# Patient Record
Sex: Female | Born: 1955 | Race: Black or African American | Hispanic: No | Marital: Single | Smoking: Never smoker
Health system: Southern US, Community
[De-identification: ages and names within clinical notes are randomized; demographics above are authoritative.]

## PROBLEM LIST (undated history)

## (undated) DIAGNOSIS — Z923 Personal history of irradiation: Secondary | ICD-10-CM

## (undated) DIAGNOSIS — Z9221 Personal history of antineoplastic chemotherapy: Secondary | ICD-10-CM

## (undated) DIAGNOSIS — I10 Essential (primary) hypertension: Secondary | ICD-10-CM

## (undated) DIAGNOSIS — C801 Malignant (primary) neoplasm, unspecified: Secondary | ICD-10-CM

## (undated) HISTORY — PX: BREAST SURGERY: SHX581

## (undated) HISTORY — PX: FRACTURE SURGERY: SHX138

---

## 1991-02-08 HISTORY — PX: MASTECTOMY: SHX3

## 1995-02-08 DIAGNOSIS — C50919 Malignant neoplasm of unspecified site of unspecified female breast: Secondary | ICD-10-CM

## 1995-02-08 HISTORY — DX: Malignant neoplasm of unspecified site of unspecified female breast: C50.919

## 2005-10-22 ENCOUNTER — Emergency Department: Payer: Self-pay | Admitting: Emergency Medicine

## 2008-12-03 ENCOUNTER — Ambulatory Visit: Payer: Self-pay | Admitting: Internal Medicine

## 2010-06-08 DIAGNOSIS — H35419 Lattice degeneration of retina, unspecified eye: Secondary | ICD-10-CM | POA: Insufficient documentation

## 2010-06-08 DIAGNOSIS — H5200 Hypermetropia, unspecified eye: Secondary | ICD-10-CM | POA: Insufficient documentation

## 2010-06-08 DIAGNOSIS — H04209 Unspecified epiphora, unspecified lacrimal gland: Secondary | ICD-10-CM | POA: Insufficient documentation

## 2017-10-30 ENCOUNTER — Other Ambulatory Visit: Payer: Self-pay

## 2017-10-30 ENCOUNTER — Encounter: Payer: Self-pay | Admitting: Emergency Medicine

## 2017-10-30 ENCOUNTER — Ambulatory Visit
Admission: EM | Admit: 2017-10-30 | Discharge: 2017-10-30 | Disposition: A | Discharge status: Home / Self Care | Payer: BC Managed Care – PPO | Attending: Internal Medicine | Admitting: Internal Medicine

## 2017-10-30 DIAGNOSIS — R05 Cough: Secondary | ICD-10-CM | POA: Diagnosis not present

## 2017-10-30 DIAGNOSIS — J069 Acute upper respiratory infection, unspecified: Secondary | ICD-10-CM | POA: Diagnosis not present

## 2017-10-30 HISTORY — DX: Essential (primary) hypertension: I10

## 2017-10-30 HISTORY — DX: Malignant (primary) neoplasm, unspecified: C80.1

## 2017-10-30 MED ORDER — HYDROCOD POLST-CPM POLST ER 10-8 MG/5ML PO SUER: 5.0000 mL | Freq: Two times a day (BID) | ORAL | 0 refills | Status: DC

## 2017-10-30 MED ORDER — BENZONATATE 200 MG PO CAPS: ORAL_CAPSULE | ORAL | 0 refills | Status: DC

## 2017-10-30 MED ORDER — FLUTICASONE PROPIONATE 50 MCG/ACT NA SUSP: 2.0000 | Freq: Every day | NASAL | 0 refills | Status: DC

## 2017-10-30 NOTE — ED Provider Notes (Signed)
[facility]    CSN: [id number] Arrival date & time: 10/30/17  1454     History   Chief Complaint Chief Complaint  Patient presents with  . URI    HPI Dorothy Mann is a 62 y.o. female.   HPI  62 year old female presents with cough that is nonproductive postnasal drip and sinus congestion.  Started about a week ago.  She said no fever or chills.  She never smoked.  Tried over-the-counter medications which have not been beneficial.  Vital signs today are afebrile.  Pulse rate of 84 blood pressure 122/88 respirations 16 and O2 sats on room air is 100%.       Past Medical History:  Diagnosis Date  . Cancer [facility])    breast cancer  . Hypertension     There are no active problems to display for this patient.   Past Surgical History:  Procedure Laterality Date  . BREAST SURGERY Left   . FRACTURE SURGERY Right     OB History   None      Home Medications    Prior to Admission medications   Medication Sig Start Date End Date Taking? Authorizing Provider  alendronate (FOSAMAX) 70 MG tablet Take by mouth. 04/29/16  Yes [provider]  Cholecalciferol (VITAMIN D-1000 MAX ST) 1000 units tablet Take by mouth.   Yes [provider]  ferrous sulfate 325 (65 FE) MG tablet Take by mouth.   Yes [provider]  folic acid (FOLVITE) 409 MCG tablet Take by mouth.   Yes [provider]  losartan (COZAAR) 100 MG tablet TAKE 1 TABLET(100 MG) BY MOUTH DAILY 04/26/17  Yes [provider]  magnesium oxide (MAG-OX) 400 MG tablet Take by mouth. 09/29/16  Yes [provider]  metoprolol succinate (TOPROL-XL) 100 MG 24 hr tablet TAKE 2 TABLETS BY MOUTH DAILY 03/07/17  Yes [provider]  vitamin B-12 (CYANOCOBALAMIN) 100 MCG tablet Take by mouth.   Yes [provider]  benzonatate (TESSALON) 200 MG capsule Take one cap TID PRN cough 10/30/17   Lorin Picket, PA-C  chlorpheniramine-HYDROcodone [facility] ER) 10-8 MG/5ML SUER Take 5 mLs by mouth 2 (two) times daily. 10/30/17   Lorin Picket, PA-C  fluticasone (FLONASE) 50 MCG/ACT nasal spray Place 2 sprays into both nostrils daily. 10/30/17   Lorin Picket, PA-C    Family History Family History  Problem Relation Age of Onset  . Healthy Mother     Social History Social History   Tobacco Use  . Smoking status: Never Smoker  . Smokeless tobacco: Never Used  Substance Use Topics  . Alcohol use: Never    Frequency: Never  . Drug use: Never     Allergies   Sulfa antibiotics   Review of Systems Review of Systems  Constitutional: Positive for activity change. Negative for appetite change, chills, fatigue and fever.  HENT: Positive for congestion and postnasal drip.   Respiratory: Positive for cough. Negative for shortness of breath, wheezing and stridor.   All other systems reviewed and are negative.    Physical Exam Triage Vital Signs ED Triage Vitals  Enc Vitals Group     BP 10/30/17 1531 122/88     Pulse Rate 10/30/17 1531 84     Resp 10/30/17 1531 16     Temp 10/30/17 1531 98.7 F (37.1 C)     Temp Source 10/30/17 1531 Oral     SpO2 10/30/17 1531 100 %  Weight 10/30/17 1529 110 lb (49.9 kg)     Height 10/30/17 1529 5\' 3"  (1.6 m)     Head Circumference --      Peak Flow --      Pain Score 10/30/17 1529 4     Pain Loc --      Pain Edu? --      Excl. in [place]? --    No data found.  Updated Vital Signs BP 122/88 (BP Location: Left Arm)   Pulse 84   Temp 98.7 F (37.1 C) (Oral)   Resp 16   Ht 5\' 3"  (1.6 m)   Wt 110 lb (49.9 kg)   SpO2 100%   BMI 19.49 kg/m    Visual Acuity Right Eye Distance:   Left Eye Distance:   Bilateral Distance:    Right Eye Near:   Left Eye Near:    Bilateral Near:     Physical Exam  Constitutional: She is oriented to person, place, and time. She appears well-developed and well-nourished. No distress.  HENT:  Head: Normocephalic.  Right Ear: External  ear normal.  Left Ear: External ear normal.  Nose: Nose normal.  Mouth/Throat: Oropharynx is clear and moist. No oropharyngeal exudate.  Eyes: Pupils are equal, round, and reactive to light. Right eye exhibits no discharge. Left eye exhibits no discharge.  Neck: Normal range of motion.  Pulmonary/Chest: Effort normal and breath sounds normal.  Musculoskeletal: Normal range of motion.  Lymphadenopathy:    She has no cervical adenopathy.  Neurological: She is alert and oriented to person, place, and time.  Skin: Skin is warm and dry. She is not diaphoretic.  Psychiatric: She has a normal mood and affect. Her behavior is normal. Judgment and thought content normal.  Nursing note and vitals reviewed.    UC Treatments / Results  Labs (all labs ordered are listed, but only abnormal results are displayed) Labs Reviewed - No data to display  EKG None  Radiology No results found.  Procedures Procedures (including critical care time)  Medications Ordered in UC Medications - No data to display  Initial Impression / Assessment and Plan / UC Course  I have reviewed the triage vital signs and the nursing notes.  Pertinent labs & imaging results that were available during my care of the patient were reviewed by me and considered in my medical decision making (see chart for details).     Advised the patient that this is likely a viral illness does not require antibiotics at this time.  Treat her symptomatically.  Prescribed for her cough suppressants.  Urged to increase fluid intake.  Also use of Flonase nasal spray for the next 2 to 3 weeks to help promote drainage.  She has high fevers or the cough is productive should return to our clinic or follow-up with primary care physician Final Clinical Impressions(s) / UC Diagnoses   Final diagnoses:  Upper respiratory tract infection, unspecified type   Discharge Instructions   None    ED Prescriptions    Medication Sig Dispense Auth.  Provider   benzonatate (TESSALON) 200 MG capsule Take one cap TID PRN cough 30 capsule Lorin Picket, PA-C   chlorpheniramine-HYDROcodone (TUSSIONEX PENNKINETIC ER) 10-8 MG/5ML SUER Take 5 mLs by mouth 2 (two) times daily. 60 mL Crecencio Mc P, PA-C   fluticasone (FLONASE) 50 MCG/ACT nasal spray Place 2 sprays into both nostrils daily. 16 g Lorin Picket, PA-C     Controlled Substance Prescriptions [place] Controlled Substance  Registry consulted? Not Applicable  Lorin Picket, PA-C 10/30/17 2105

## 2017-10-30 NOTE — ED Triage Notes (Signed)
Pt c/o cough, post nasal drip, sinus congestion. Started about a week ago. Denies fever.

## 2017-12-07 ENCOUNTER — Other Ambulatory Visit: Payer: Self-pay

## 2017-12-07 ENCOUNTER — Encounter: Payer: Self-pay | Admitting: Family Medicine

## 2017-12-07 ENCOUNTER — Telehealth: Payer: Self-pay | Admitting: Family Medicine

## 2017-12-07 ENCOUNTER — Ambulatory Visit: Payer: BC Managed Care – PPO | Admitting: Family Medicine

## 2017-12-07 VITALS — BP 94/65 | HR 67 | Temp 98.3°F | Ht 63.5 in | Wt 121.0 lb

## 2017-12-07 DIAGNOSIS — Z853 Personal history of malignant neoplasm of breast: Secondary | ICD-10-CM | POA: Insufficient documentation

## 2017-12-07 DIAGNOSIS — I10 Essential (primary) hypertension: Secondary | ICD-10-CM | POA: Insufficient documentation

## 2017-12-07 DIAGNOSIS — I5022 Chronic systolic (congestive) heart failure: Secondary | ICD-10-CM | POA: Diagnosis not present

## 2017-12-07 DIAGNOSIS — Z7689 Persons encountering health services in other specified circumstances: Secondary | ICD-10-CM

## 2017-12-07 DIAGNOSIS — I428 Other cardiomyopathies: Secondary | ICD-10-CM

## 2017-12-07 NOTE — Assessment & Plan Note (Signed)
Followed by Cardiology. Stable, continue current regimen 

## 2017-12-07 NOTE — Progress Notes (Signed)
   BP 94/65   Pulse 67   Temp 98.3 F (36.8 C) (Oral)   Ht 5' 3.5" (1.613 m)   Wt 121 lb (54.9 kg)   SpO2 97%   BMI 21.10 kg/m    Subjective:    Patient ID: Dorothy Mann, female    DOB: 01-13-1956, 62 y.o.   MRN: [id number]  HPI: Dorothy Mann is a 63 y.o. female  Chief Complaint  Patient presents with  . New Patient (Initial Visit)    pt would like to discuss about her feet pain and swelling x 2 weeks off and on   Here today for new patient visit.   Followed by Cardiology for NICM, systolic HF, HTN. Last visit yesterday where lasix was increased to 40 mg for the next week given increased LE edema. Has f/u in 1 week for that.   Only other medical issue known is hx of left breast cancer 27 years ago. Followed by Oncology annually. In remission.   Last CPE was 06/2016.   Relevant past medical, surgical, family and social history reviewed and updated as indicated. Interim medical history since our last visit reviewed. Allergies and medications reviewed and updated.  Review of Systems  Per HPI unless specifically indicated above     Objective:    BP 94/65   Pulse 67   Temp 98.3 F (36.8 C) (Oral)   Ht 5' 3.5" (1.613 m)   Wt 121 lb (54.9 kg)   SpO2 97%   BMI 21.10 kg/m   Wt Readings from Last 3 Encounters:  12/07/17 121 lb (54.9 kg)  10/30/17 110 lb (49.9 kg)    Physical Exam  Constitutional: She is oriented to person, place, and time. She appears well-developed and well-nourished.  HENT:  Head: Atraumatic.  Eyes: Conjunctivae and EOM are normal.  Neck: Normal range of motion. Neck supple.  Cardiovascular: Normal rate and regular rhythm.  Pulmonary/Chest: Effort normal and breath sounds normal.  Musculoskeletal: Normal range of motion. She exhibits edema (2+ pitting edema b/l LEs).  Neurological: She is alert and oriented to person, place, and time.  Skin: Skin is warm and dry.  Psychiatric: She has a normal mood and affect. Her behavior is normal.  Nursing  note and vitals reviewed.   No results found for this or any previous visit.    Assessment & Plan:   Problem List Items Addressed This Visit      Cardiovascular and Mediastinum   Hypertension    Followed by Cardiology. Stable, continue current regimen      Relevant Medications   furosemide (LASIX) 20 MG tablet   spironolactone (ALDACTONE) 25 MG tablet   Chronic systolic HF (heart failure) ([place])    Followed by Cardiology. Stable, continue current regimen      Relevant Medications   furosemide (LASIX) 20 MG tablet   spironolactone (ALDACTONE) 25 MG tablet   NICM (nonischemic cardiomyopathy) ([place])    Followed by Cardiology. Stable, continue current regimen      Relevant Medications   furosemide (LASIX) 20 MG tablet   spironolactone (ALDACTONE) 25 MG tablet     Other   History of left breast cancer    In remission, followed annually by Oncology       Other Visit Diagnoses    Encounter to establish care    -  Primary       Follow up plan: Return for CPE.

## 2017-12-07 NOTE — Assessment & Plan Note (Signed)
In remission, followed annually by Oncology

## 2017-12-07 NOTE — Telephone Encounter (Signed)
Noted. Will follow up on this at upcoming appt

## 2017-12-07 NOTE — Patient Instructions (Signed)
Follow up for CPE 

## 2017-12-07 NOTE — Telephone Encounter (Signed)
Patient wanted to let you know that the center part of her back hurts after driving to and from work. She forgot to mention this to you during the visit.  She said it aches like a toothache. If you need to reach her regarding this issue before her next visit she said to please call.  Thank you  [phone]

## 2017-12-22 ENCOUNTER — Encounter: Payer: BC Managed Care – PPO | Admitting: Family Medicine

## 2018-01-10 ENCOUNTER — Encounter: Payer: BC Managed Care – PPO | Admitting: Family Medicine

## 2018-03-22 ENCOUNTER — Ambulatory Visit (INDEPENDENT_AMBULATORY_CARE_PROVIDER_SITE_OTHER): Payer: BC Managed Care – PPO

## 2018-03-22 ENCOUNTER — Encounter: Payer: Self-pay | Admitting: Podiatry

## 2018-03-22 ENCOUNTER — Other Ambulatory Visit: Payer: Self-pay | Admitting: Podiatry

## 2018-03-22 ENCOUNTER — Ambulatory Visit: Payer: BC Managed Care – PPO | Admitting: Podiatry

## 2018-03-22 VITALS — BP 114/76 | HR 83

## 2018-03-22 DIAGNOSIS — M79673 Pain in unspecified foot: Secondary | ICD-10-CM

## 2018-03-22 DIAGNOSIS — M722 Plantar fascial fibromatosis: Secondary | ICD-10-CM

## 2018-03-22 NOTE — Progress Notes (Signed)
This patient presents the office with chief complaint of burning and sharp pain noted through the bottom of her feet.  She states that this problem occurs walking and wearing her shoes.  She also has a history of excessive swelling noted in both her leg and feet due to heart pathology.  She states that this pain has been present for approximately 4 weeks and not improving.  She says she was seen by her cardiologist who prescribed gabapentin for the burning.  She denies any history of trauma or injury to the foot.  She presents the office today for an evaluation and treatment of her painful feet.  Vascular  Dorsalis pedis and posterior tibial pulses are palpable  B/L.  Capillary return  WNL.  Temperature gradient is  WNL.  Skin turgor  WNL  Sensorium  Senn Weinstein monofilament wire  WNL. Normal tactile sensation.  Nail Exam  Patient has normal nails with no evidence of bacterial or fungal infection.  Orthopedic  Exam  Muscle tone and muscle strength  WNL.  No limitations of motion feet  B/L.  No crepitus or joint effusion noted.  Foot type is unremarkable and digits show no abnormalities.  Bony prominences are unremarkable. Palpable pain in the center of her arch  B/L.  Swelling in lower leg and feet.   Skin  No open lesions.  Normal skin texture and turgor.  Plantar fasciitis  B/L  IE.  Discussed this condition with this patient.  Explained that she has plantar fasciitis and prescribed power step insoles to be worn in her shoes.  These insoles were placed in her shoes and she felt immediate relief.  Patient is to return to the office in 3 weeks for continued evaluation and treatment  Gardiner Barefoot DPM

## 2018-04-12 ENCOUNTER — Ambulatory Visit: Payer: BC Managed Care – PPO | Admitting: Podiatry

## 2018-09-11 ENCOUNTER — Ambulatory Visit
Admission: EM | Admit: 2018-09-11 | Discharge: 2018-09-11 | Disposition: A | Discharge status: Home / Self Care | Payer: BC Managed Care – PPO | Attending: Family Medicine | Admitting: Family Medicine

## 2018-09-11 ENCOUNTER — Encounter: Payer: Self-pay | Admitting: Emergency Medicine

## 2018-09-11 ENCOUNTER — Ambulatory Visit (INDEPENDENT_AMBULATORY_CARE_PROVIDER_SITE_OTHER): Payer: BC Managed Care – PPO

## 2018-09-11 ENCOUNTER — Other Ambulatory Visit: Payer: Self-pay

## 2018-09-11 DIAGNOSIS — M79645 Pain in left finger(s): Secondary | ICD-10-CM

## 2018-09-11 DIAGNOSIS — M7989 Other specified soft tissue disorders: Secondary | ICD-10-CM | POA: Diagnosis not present

## 2018-09-11 MED ORDER — DOXYCYCLINE HYCLATE 100 MG PO CAPS: 100.0000 mg | ORAL_CAPSULE | Freq: Two times a day (BID) | ORAL | 0 refills | Status: DC

## 2018-09-11 MED ORDER — PREDNISONE 10 MG (21) PO TBPK: ORAL_TABLET | ORAL | 0 refills | Status: DC

## 2018-09-11 NOTE — ED Triage Notes (Signed)
Pt c/o left hand pain and swelling. swelling started in her left ring finger and goes up into her hand. Started about a week ago. No known injury.

## 2018-09-11 NOTE — ED Provider Notes (Signed)
[facility]    CSN: [id number] Arrival date & time: 09/11/18  1439  History   Chief Complaint Chief Complaint  Patient presents with   Hand Pain    left   HPI  63 year old female presents with the above complaint.  Patient reports that approximately 3 weeks ago she developed pain of her left ring finger.  Over the past week and over the weekend she developed worsening pain, swelling, and nail erythema (PIP joint).  Erythema is extending proximally on the dorsum of her hand.  Denies injury.  No fever.  Area is warm to the touch.  Pain is 8/10 in severity.  Worse with palpation and range of motion.  No other joints affected.  No other complaints or concerns at this time.  PMH, Surgical Hx, Family Hx, Social History reviewed and updated as below.  Past Medical History:  Diagnosis Date   Cancer [facility])    breast cancer   Hypertension     Patient Active Problem List   Diagnosis Date Noted   Hypertension 96/22/2979   Chronic systolic HF (heart failure) ([place]) 12/07/2017   NICM (nonischemic cardiomyopathy) ([place]) 12/07/2017   History of left breast cancer 12/07/2017    Past Surgical History:  Procedure Laterality Date   BREAST SURGERY Left    FRACTURE SURGERY Right     OB History   No obstetric history on file.      Home Medications    Prior to Admission medications   Medication Sig Start Date End Date Taking? Authorizing Provider  alendronate (FOSAMAX) 70 MG tablet Take by mouth. 04/29/16  Yes [provider]  Cholecalciferol (VITAMIN D-1000 MAX ST) 1000 units tablet Take by mouth.   Yes [provider]  ferrous sulfate 325 (65 FE) MG tablet Take by mouth.   Yes [provider]  folic acid (FOLVITE) 892 MCG tablet Take by mouth.   Yes [provider]  furosemide (LASIX) 20 MG tablet  03/20/18  Yes [provider]  gabapentin (NEURONTIN) 100 MG capsule Take by mouth. 03/14/18 03/14/19 Yes [provider]   magnesium oxide (MAG-OX) 400 MG tablet Take by mouth. 09/29/16  Yes [provider]  MAGNESIUM-OXIDE 400 (241.3 Mg) MG tablet TK 1 T PO  QD 11/23/17  Yes [provider]  metoprolol succinate (TOPROL-XL) 100 MG 24 hr tablet Take by mouth. 12/19/17  Yes [provider]  vitamin B-12 (CYANOCOBALAMIN) 100 MCG tablet Take by mouth.   Yes [provider]  doxycycline (VIBRAMYCIN) 100 MG capsule Take 1 capsule (100 mg total) by mouth 2 (two) times daily. 09/11/18   Coral Spikes, DO  predniSONE (STERAPRED UNI-PAK 21 TAB) 10 MG (21) TBPK tablet 6 tablets on day 1; decrease by 1 tablet daily until gone. 09/11/18   Coral Spikes, DO  spironolactone (ALDACTONE) 25 MG tablet Take by mouth. 01/17/17 01/17/18  [provider]  UNABLE TO FIND Take by mouth.    [provider]  UNABLE TO FIND TK 2 TS PO D 01/19/18   [provider]  losartan (COZAAR) 100 MG tablet TAKE 1 TABLET(100 MG) BY MOUTH DAILY 04/26/17 09/11/18  [provider]    Family History Family History  Problem Relation Age of Onset   Healthy Mother     Social History Social History   Tobacco Use   Smoking status: Never Smoker   Smokeless tobacco: Never Used  Substance Use Topics   Alcohol use: Not Currently  Frequency: Never   Drug use: Never     Allergies   Sulfa antibiotics, Aspirin, and Ramipril   Review of Systems Review of Systems  Constitutional: Negative.   Musculoskeletal:       Pain, swelling, redness - Left ring finger.   Physical Exam Triage Vital Signs ED Triage Vitals  Enc Vitals Group     BP 09/11/18 1451 98/83     Pulse Rate 09/11/18 1451 88     Resp 09/11/18 1451 18     Temp 09/11/18 1451 98.6 F (37 C)     Temp Source 09/11/18 1451 Oral     SpO2 09/11/18 1451 100 %     Weight 09/11/18 1448 114 lb (51.7 kg)     Height 09/11/18 1448 5\' 4"  (1.626 m)     Head Circumference --      Peak Flow --      Pain Score 09/11/18 1448 8       Pain Loc --      Pain Edu? --      Excl. in [place]? --    Updated Vital Signs BP 98/83 (BP Location: Left Arm)    Pulse 88    Temp 98.6 F (37 C) (Oral)    Resp 18    Ht 5\' 4"  (1.626 m)    Wt 51.7 kg    SpO2 100%    BMI 19.57 kg/m   Visual Acuity Right Eye Distance:   Left Eye Distance:   Bilateral Distance:    Right Eye Near:   Left Eye Near:    Bilateral Near:     Physical Exam Vitals signs and nursing note reviewed.  Constitutional:      General: She is not in acute distress.    Appearance: Normal appearance.  HENT:     Head: Normocephalic and atraumatic.  Eyes:     General:        Right eye: No discharge.        Left eye: No discharge.     Conjunctiva/sclera: Conjunctivae normal.  Pulmonary:     Effort: Pulmonary effort is normal. No respiratory distress.  Musculoskeletal:     Comments: Left 4th digit -severe swelling of the PIP joint.  Extends proximally.  Area is warm to touch and is erythematous as well.  Skin:    Comments: Erythema noted to the left ring finger and extends proximally over the dorsum of the hand to the wrist.  Neurological:     Mental Status: She is alert.  Psychiatric:        Mood and Affect: Mood normal.        Behavior: Behavior normal.    UC Treatments / Results  Labs (all labs ordered are listed, but only abnormal results are displayed) Labs Reviewed - No data to display  EKG   Radiology Dg Finger Ring Left  Result Date: 09/11/2018 CLINICAL DATA:  Pain, redness, and swelling of LEFT ring finger for about a week, goes up into hand, no known injury EXAM: LEFT RING FINGER 2+V COMPARISON:  None FINDINGS: Osseous demineralization. Soft tissue swelling, centered at PIP joint. No definite acute fracture, dislocation, or bone destruction. PIP joint is obscured on the lateral view. IMPRESSION: Soft tissue swelling PIP joint LEFT ring finger. No definite acute bony findings though the PIP joint is obscured on the lateral view. Electronically  Signed   By: Lavonia Dana M.D.   On: 09/11/2018 15:43    Procedures Procedures (including critical care  time)  Medications Ordered in UC Medications - No data to display  Initial Impression / Assessment and Plan / UC Course  I have reviewed the triage vital signs and the nursing notes.  Pertinent labs & imaging results that were available during my care of the patient were reviewed by me and considered in my medical decision making (see chart for details).    63 year old female presents with pain, swelling, and erythema which starts at the left fourth PIP joint and extends proximally.  Concern for cellulitis.  Placing on doxycycline.  Also placing on prednisone given likely synovitis as well.  Final Clinical Impressions(s) / UC Diagnoses   Final diagnoses:  Finger swelling     Discharge Instructions     Medication as prescribed.  Please see [place] or your primary for follow up.  If you worsen, go to the hospital.  Take care  Dr. Lacinda Axon    ED Prescriptions    Medication Sig [place]. Provider   predniSONE (STERAPRED UNI-PAK 21 TAB) 10 MG (21) TBPK tablet 6 tablets on day 1; decrease by 1 tablet daily until gone. 21 tablet Shamikia Linskey G, DO   doxycycline (VIBRAMYCIN) 100 MG capsule Take 1 capsule (100 mg total) by mouth 2 (two) times daily. 14 capsule Coral Spikes, DO     Controlled Substance Prescriptions [place] Controlled Substance Registry consulted? Not Applicable   Coral Spikes, DO 09/11/18 1649

## 2018-09-11 NOTE — Discharge Instructions (Addendum)
Medication as prescribed.  Please see [place] or your primary for follow up.  If you worsen, go to the hospital.  Take care  Dr. Lacinda Axon

## 2019-01-15 ENCOUNTER — Other Ambulatory Visit: Payer: Self-pay | Admitting: Hematology and Oncology

## 2019-01-15 DIAGNOSIS — Z1231 Encounter for screening mammogram for malignant neoplasm of breast: Secondary | ICD-10-CM

## 2019-01-16 ENCOUNTER — Other Ambulatory Visit: Payer: Self-pay | Admitting: *Deleted

## 2019-01-16 ENCOUNTER — Inpatient Hospital Stay
Admission: RE | Admit: 2019-01-16 | Discharge: 2019-01-16 | Disposition: A | Discharge status: Home / Self Care | Payer: Self-pay | Source: Ambulatory Visit | Attending: *Deleted | Admitting: *Deleted

## 2019-01-16 DIAGNOSIS — Z1231 Encounter for screening mammogram for malignant neoplasm of breast: Secondary | ICD-10-CM

## 2019-01-24 ENCOUNTER — Other Ambulatory Visit: Payer: Self-pay

## 2019-01-24 ENCOUNTER — Ambulatory Visit
Admission: RE | Admit: 2019-01-24 | Discharge: 2019-01-24 | Disposition: A | Discharge status: Home / Self Care | Payer: BC Managed Care – PPO | Source: Ambulatory Visit | Attending: Hematology and Oncology | Admitting: Hematology and Oncology

## 2019-01-24 DIAGNOSIS — Z1231 Encounter for screening mammogram for malignant neoplasm of breast: Secondary | ICD-10-CM | POA: Diagnosis not present

## 2019-01-24 HISTORY — DX: Personal history of antineoplastic chemotherapy: Z92.21

## 2019-01-24 HISTORY — DX: Personal history of irradiation: Z92.3

## 2019-03-05 ENCOUNTER — Ambulatory Visit (INDEPENDENT_AMBULATORY_CARE_PROVIDER_SITE_OTHER): Payer: BC Managed Care – PPO

## 2019-03-05 ENCOUNTER — Ambulatory Visit
Admission: EM | Admit: 2019-03-05 | Discharge: 2019-03-05 | Disposition: A | Discharge status: Home / Self Care | Payer: BC Managed Care – PPO | Attending: Emergency Medicine | Admitting: Emergency Medicine

## 2019-03-05 ENCOUNTER — Other Ambulatory Visit: Payer: Self-pay

## 2019-03-05 DIAGNOSIS — M25561 Pain in right knee: Secondary | ICD-10-CM

## 2019-03-05 DIAGNOSIS — M25461 Effusion, right knee: Secondary | ICD-10-CM

## 2019-03-05 DIAGNOSIS — M25562 Pain in left knee: Secondary | ICD-10-CM | POA: Diagnosis not present

## 2019-03-05 MED ORDER — IBUPROFEN 400 MG PO TABS: 400.0000 mg | ORAL_TABLET | Freq: Four times a day (QID) | ORAL | 0 refills | Status: DC | PRN

## 2019-03-05 NOTE — ED Provider Notes (Signed)
HPI  SUBJECTIVE:  Dorothy Mann is a 64 y.o. female who presents with bilateral knee pain for a week.  She states it has been alternating between the left and right knees, but today both of them hurt.  She states that she could not walk this morning secondary to the pain.  Was able to walk yesterday.  She describes the pain as sore, constant, located anteriorly.  No trauma, change in physical activity, body aches, fevers, erythema, swelling.  No calf or leg swelling.  She denies CHF symptoms.  She denies leg weakness, new or different distal numbness or tingling.  No sensation of instability.  Her knees have not given way.  She tried an Ace wrap with significant improvement in her symptoms yesterday and 400 mg of ibuprofen with improvement in her symptoms today.  Symptoms are worse with weightbearing.  States that her hip and ankle are without pain or injury.  She also reports bilateral feet swelling after sitting for prolonged period of time which is worse in the evening, and has completely resolved by the morning.  Again denies calf or leg swelling.  She has a past medical history of CHF with an EF of 40 to 45%, tibial fracture right knee that is post reconstruction, breast cancer 27 years ago status post chemo with resulting osteoporosis.  No history of diabetes, hypertension, GI bleed, peptic ulcer disease, chronic kidney disease, rheumatoid arthritis, osteoarthritis.  PMD: Dr. Lavera Guise.    Past Medical History:  Diagnosis Date  . Breast cancer ([place]) 1997   left breast  . Cancer ([place])    breast cancer  . Hypertension   . Personal history of chemotherapy   . Personal history of radiation therapy     Past Surgical History:  Procedure Laterality Date  . BREAST SURGERY Left   . FRACTURE SURGERY Right   . MASTECTOMY Left 1993    Family History  Problem Relation Age of Onset  . Healthy Mother   . Breast cancer Neg Hx     Social History   Tobacco Use  . Smoking status: Never Smoker   . Smokeless tobacco: Never Used  Substance Use Topics  . Alcohol use: Not Currently  . Drug use: Never    No current facility-administered medications for this encounter.  Current Outpatient Medications:  .  alendronate (FOSAMAX) 70 MG tablet, Take by mouth., Disp: , Rfl:  .  Cholecalciferol (VITAMIN D-1000 MAX ST) 1000 units tablet, Take by mouth., Disp: , Rfl:  .  ferrous sulfate 325 (65 FE) MG tablet, Take by mouth., Disp: , Rfl:  .  folic acid (FOLVITE) A999333 MCG tablet, Take by mouth., Disp: , Rfl:  .  furosemide (LASIX) 20 MG tablet, , Disp: , Rfl:  .  gabapentin (NEURONTIN) 100 MG capsule, Take by mouth., Disp: , Rfl:  .  magnesium oxide (MAG-OX) 400 MG tablet, Take by mouth., Disp: , Rfl:  .  MAGNESIUM-OXIDE 400 (241.3 Mg) MG tablet, TK 1 T PO  QD, Disp: , Rfl: 2 .  metoprolol succinate (TOPROL-XL) 100 MG 24 hr tablet, Take by mouth., Disp: , Rfl:  .  spironolactone (ALDACTONE) 25 MG tablet, Take by mouth., Disp: , Rfl:  .  vitamin B-12 (CYANOCOBALAMIN) 100 MCG tablet, Take by mouth., Disp: , Rfl:  .  ibuprofen (ADVIL) 400 MG tablet, Take 1 tablet (400 mg total) by mouth every 6 (six) hours as needed., Disp: 30 tablet, Rfl: 0  Allergies  Allergen Reactions  . Sulfa Antibiotics   .  Aspirin Anxiety  . Ramipril     Chronic dry cough     ROS  As noted in HPI.   Physical Exam  BP 112/79 (BP Location: Right Arm)   Pulse 92   Temp 98.5 F (36.9 C) (Oral)   Resp 16   Ht 5\' 4"  (1.626 m)   Wt 49.9 kg   SpO2 99%   BMI 18.88 kg/m   Constitutional: Well developed, well nourished, no acute distress Eyes:  EOMI, conjunctiva normal bilaterally HENT: Normocephalic, atraumatic,mucus membranes moist Respiratory: Normal inspiratory effort Cardiovascular: Normal rate GI: nondistended skin: No rash, skin intact Musculoskeletal:  L Knee ROM decreased due to pain, Flexion  intact,  Patella tender, Patellar tendon tender, Medial joint NT, Lateral joint NT, Popliteal region NT,  Varus MCL stress testing stable, Valgus LCL stress testing stable, McMurray's testing  abnormal, Lachman's negative. Distal NVI with intact baseline sensation / motor / pulse distal to knee.  No effusion. No erythema. No increased temperature. No crepitus.  No calf swelling, edema.  R Knee large scar over the tibia.  ROM decreased due to pain, Flexion  Intact, tenderness lateral to the patella . patella NT, Patellar tendon NT, Medial joint NT, Lateral joint NT, Popliteal region NT, Varus MCL stress testing stable, Valgus LCL stress testing stable but painful, McMurray's testing normal, Lachman's negative. Distal NVI with intact baseline sensation / motor / pulse distal to knee.  + effusion. No erythema. No increased temperature. No crepitus.  No calf swelling, edema  Neurologic: Alert & oriented x 3, no focal neuro deficits Psychiatric: Speech and behavior appropriate   ED Course   Medications - No data to display  Orders Placed This Encounter  Procedures  . DG Knee Complete 4 Views Left    Standing Status:   Standing    Number of Occurrences:   1    Order Specific Question:   Reason for Exam (SYMPTOM  OR DIAGNOSIS REQUIRED)    Answer:   Atraumatic bilateral knee pain rule out arthritis effusion facture  . DG Knee Complete 4 Views Right    Standing Status:   Standing    Number of Occurrences:   1    Order Specific Question:   Reason for Exam (SYMPTOM  OR DIAGNOSIS REQUIRED)    Answer:   Atraumatic bilateral knee pain rule out arthritis effusion facture    No results found for this or any previous visit (from the past 24 hour(s)). DG Knee Complete 4 Views Left  Result Date: 03/05/2019 CLINICAL DATA:  Bilateral knee pain EXAM: LEFT KNEE - COMPLETE 4+ VIEW COMPARISON:  None. FINDINGS: No visible joint effusion. Normal joint space height. No osteophytes. No cysts or other focal lesions. IMPRESSION: Normal Electronically Signed   By: Nelson Chimes M.D.   On: 03/05/2019 12:39   DG Knee  Complete 4 Views Right  Result Date: 03/05/2019 CLINICAL DATA:  Bilateral knee pain EXAM: RIGHT KNEE - COMPLETE 4+ VIEW COMPARISON:  None. FINDINGS: Previous surgical reconstruction of the proximal tibia. Moderate to large joint effusion. Narrowing and irregularity of the lateral weight-bearing compartment. Mild joint space narrowing of the medial compartment and patellofemoral joint. No acute finding otherwise. IMPRESSION: Previous tibial reconstruction. Moderate to large joint effusion. Degenerative changes, most pronounced in the lateral compartment. Electronically Signed   By: Nelson Chimes M.D.   On: 03/05/2019 12:40    ED Clinical Impression  1. Acute pain of both knees   2. Effusion of right knee  ED Assessment/Plan  Both knee exams are different, however suspect osteoarthritis in both knees.  May have lateral collateral ligament problem with right knee, meniscus left knee.  Will x-ray both due to history of osteoporosis.  No evidence of gout, septic joint.  Also think that bilateral new onset gout or septic joint would be unusual.  Reviewed imaging independently.  Left knee.  Normal.  Right knee: Moderate to large joint effusion, degenerative changes most pronounced in the lateral compartment.  See radiology report for full details.  Suspect the effusion could be from arthritis or from the hardware.  Discussed with patient that this will need to get tapped for her diagnostic and therapeutic purposes.  She will start using an Ace wrap, she states that helped her yesterday.  Left knee x-ray completely normal.  Suspect the pain is from compensating for the right knee, or could be a meniscal issue.  Plan to send home with ibuprofen 400 mg combined with 500 mg of Tylenol 3-4 times a day as needed for pain, ice for 20 minutes at a time, follow-up with Dr. Marry Guan, orthopedics on call or [facility] ASAP.  Suspect the foot swelling is mild dependent edema.  Doubt CHF.  Advised her to elevate  as needed.  Also discussed diagnostic and treatment plan with family via telephone. Discussed imaging, MDM, treatment plan, and plan for follow-up with patient.  Discussed sn/sx that should prompt return to the ED. patient agrees with plan.   Meds ordered this encounter  Medications  . ibuprofen (ADVIL) 400 MG tablet    Sig: Take 1 tablet (400 mg total) by mouth every 6 (six) hours as needed.    Dispense:  30 tablet    Refill:  0    *This clinic note was created using [employer]. Therefore, there may be occasional mistakes despite careful proofreading.   ?    Melynda Ripple, MD 03/05/19 1257

## 2019-03-05 NOTE — ED Triage Notes (Signed)
Patient complains of bilateral knee pain that she first noticed around 1 week ago. States that pain has gradually been worsening, unable to bear weight. Patient states that she has noticed feet swelling as well.

## 2019-03-05 NOTE — Discharge Instructions (Addendum)
your right knee shows that you have some arthritic changes.  He also have some fluid in that joint as well.  This could have been there for some time, and it may need to be drained.  Follow-up with Dr. Marry Guan or with [facility] as soon as he can to have this done.  Your left knee is fine.  There is no evidence of arthritis or fluid in that knee.  Ace wrap as needed.

## 2019-05-31 ENCOUNTER — Ambulatory Visit
Admission: EM | Admit: 2019-05-31 | Discharge: 2019-05-31 | Disposition: A | Discharge status: Home / Self Care | Payer: BC Managed Care – PPO | Attending: Family Medicine | Admitting: Family Medicine

## 2019-05-31 ENCOUNTER — Encounter: Payer: Self-pay | Admitting: Emergency Medicine

## 2019-05-31 ENCOUNTER — Other Ambulatory Visit: Payer: Self-pay

## 2019-05-31 DIAGNOSIS — M7989 Other specified soft tissue disorders: Secondary | ICD-10-CM | POA: Diagnosis not present

## 2019-05-31 LAB — URIC ACID: Uric Acid, Serum: 11.2 mg/dL — ABNORMAL HIGH (ref 2.5–7.1)

## 2019-05-31 MED ORDER — PREDNISONE 10 MG PO TABS: ORAL_TABLET | ORAL | 0 refills | Status: DC

## 2019-05-31 NOTE — ED Triage Notes (Signed)
Patient c/o left ring finger pain and swelling that started 2 weeks ago.  Patient denies injury.  Patient also reports tenderness and swelling on the side of her right foot.  Patient denies injury to her foot.

## 2019-05-31 NOTE — ED Provider Notes (Signed)
[facility]    CSN: [id number] Arrival date & time: 05/31/19  1551  History   Chief Complaint Chief Complaint  Patient presents with  . Finger Pain    left ring   HPI  64 year old female presents with the above complaint.  Patient has been seen previously for this.  Was seen by me on 09/11/18.  At that time patient had left ring finger swelling and pain.  Patient was treated empirically with doxycycline and prednisone.  Patient states that the pain improved and the swelling essentially stayed the same.  Patient reports that over the past 2 weeks her left ring finger has been more swollen.  It is quite large currently.  It is mildly red and tender.  She rates her pain is 8/10 in severity.  Patient also reports pain of the right foot at the base of the fifth metatarsal.  No relieving factors.  No fall, trauma, injury.  Prior x-ray negative.  No other complaints at this time.    Past Medical History:  Diagnosis Date  . Breast cancer ([place]) 1997   left breast  . Cancer ([place])    breast cancer  . Hypertension   . Personal history of chemotherapy   . Personal history of radiation therapy     Patient Active Problem List   Diagnosis Date Noted  . Hypertension 12/07/2017  . Chronic systolic HF (heart failure) ([place]) 12/07/2017  . NICM (nonischemic cardiomyopathy) ([place]) 12/07/2017  . History of left breast cancer 12/07/2017    Past Surgical History:  Procedure Laterality Date  . BREAST SURGERY Left   . FRACTURE SURGERY Right   . MASTECTOMY Left 1993    OB History   No obstetric history on file.      Home Medications    Prior to Admission medications   Medication Sig Start Date End Date Taking? Authorizing Provider  alendronate (FOSAMAX) 70 MG tablet Take by mouth. 04/29/16  Yes [provider]  Cholecalciferol (VITAMIN D-1000 MAX ST) 1000 units tablet Take by mouth.   Yes [provider]  ferrous sulfate 325 (65 FE) MG tablet Take by mouth.    Yes [provider]  folic acid (FOLVITE) A999333 MCG tablet Take by mouth.   Yes [provider]  furosemide (LASIX) 20 MG tablet  03/20/18  Yes [provider]  magnesium oxide (MAG-OX) 400 MG tablet Take by mouth. 09/29/16  Yes [provider]  metoprolol succinate (TOPROL-XL) 100 MG 24 hr tablet Take by mouth. 12/19/17  Yes [provider]  spironolactone (ALDACTONE) 25 MG tablet Take by mouth. 01/17/17 05/31/19 Yes [provider]  vitamin B-12 (CYANOCOBALAMIN) 100 MCG tablet Take by mouth.   Yes [provider]  gabapentin (NEURONTIN) 100 MG capsule Take by mouth. 03/14/18 03/14/19  [provider]  ibuprofen (ADVIL) 400 MG tablet Take 1 tablet (400 mg total) by mouth every 6 (six) hours as needed. 03/05/19   Melynda Ripple, MD  predniSONE (DELTASONE) 10 MG tablet 50 mg daily x 2 days, then 40 mg daily x 2 days, then 30 mg daily x 2 days, then 20 mg daily x 2 days, then 10 mg daily x 2 days. 05/31/19   Coral Spikes, DO  losartan (COZAAR) 100 MG tablet TAKE 1 TABLET(100 MG) BY MOUTH DAILY 04/26/17 09/11/18  [provider]    Family History Family History  Problem Relation Age of Onset  . Healthy Mother   . Breast cancer Neg Hx  Social History Social History   Tobacco Use  . Smoking status: Never Smoker  . Smokeless tobacco: Never Used  Substance Use Topics  . Alcohol use: Not Currently  . Drug use: Never     Allergies   Sulfa antibiotics, Aspirin, and Ramipril   Review of Systems Review of Systems  Musculoskeletal:       Left ring finger swelling & pain. Foot pain.   Physical Exam Triage Vital Signs ED Triage Vitals  Enc Vitals Group     BP 05/31/19 1614 110/86     Pulse Rate 05/31/19 1614 76     Resp 05/31/19 1614 14     Temp 05/31/19 1614 98.4 F (36.9 C)     Temp Source 05/31/19 1614 Oral     SpO2 05/31/19 1614 100 %     Weight 05/31/19 1611 108 lb (49 kg)     Height 05/31/19 1611 5'  4" (1.626 m)     Head Circumference --      Peak Flow --      Pain Score 05/31/19 1611 8     Pain Loc --      Pain Edu? --      Excl. in [place]? --    Updated Vital Signs BP 110/86 (BP Location: Right Arm)   Pulse 76   Temp 98.4 F (36.9 C) (Oral)   Resp 14   Ht 5\' 4"  (1.626 m)   Wt 49 kg   SpO2 100%   BMI 18.54 kg/m   Visual Acuity Right Eye Distance:   Left Eye Distance:   Bilateral Distance:    Right Eye Near:   Left Eye Near:    Bilateral Near:     Physical Exam Vitals and nursing note reviewed.  Constitutional:      General: She is not in acute distress.    Appearance: Normal appearance. She is not ill-appearing.  HENT:     Head: Normocephalic and atraumatic.  Eyes:     General:        Right eye: No discharge.        Left eye: No discharge.     Conjunctiva/sclera: Conjunctivae normal.  Pulmonary:     Effort: Pulmonary effort is normal. No respiratory distress.  Musculoskeletal:     Comments: Marked swelling noted of the left fourth digit at the PIP joint.  Mild erythema.  Exquisitely tender to palpation.  Feet:     Comments: Right foot with tenderness to palpation at the base of the fifth metatarsal. Neurological:     Mental Status: She is alert.  Psychiatric:        Mood and Affect: Mood normal.        Behavior: Behavior normal.    UC Treatments / Results  Labs (all labs ordered are listed, but only abnormal results are displayed) Labs Reviewed  URIC ACID - Abnormal; Notable for the following components:      Result Value   Uric Acid, Serum 11.2 (*)    All other components within normal limits    EKG   Radiology No results found.  Procedures Procedures (including critical care time)  Medications Ordered in UC Medications - No data to display  Initial Impression / Assessment and Plan / UC Course  I have reviewed the triage vital signs and the nursing notes.  Pertinent labs & imaging results that were available during my care of the  patient were reviewed by me and considered in my medical decision making (  see chart for details).    64 year old female presents with finger swelling.  Patient also has pain in the right foot.  Suspect gout.  Uric acid elevated at 11.2.  Placing on prednisone.  Needs follow-up with primary care physician.  Final Clinical Impressions(s) / UC Diagnoses   Final diagnoses:  Finger swelling     Discharge Instructions     Steroid as prescribed.  Awaiting blood work results.  Follow up with your PCP.  Take care  Dr. Lacinda Axon    ED Prescriptions    Medication Sig Dispense Auth. Provider   predniSONE (DELTASONE) 10 MG tablet 50 mg daily x 2 days, then 40 mg daily x 2 days, then 30 mg daily x 2 days, then 20 mg daily x 2 days, then 10 mg daily x 2 days. 30 tablet Coral Spikes, DO     PDMP not reviewed this encounter.   Coral Spikes, [place] 05/31/19 1730

## 2019-05-31 NOTE — Discharge Instructions (Signed)
Steroid as prescribed.  Awaiting blood work results.  Follow up with your PCP.  Take care  Dr. Lacinda Axon

## 2019-11-06 DIAGNOSIS — M1A09X0 Idiopathic chronic gout, multiple sites, without tophus (tophi): Secondary | ICD-10-CM | POA: Insufficient documentation

## 2020-09-04 ENCOUNTER — Other Ambulatory Visit: Payer: Self-pay

## 2020-09-04 ENCOUNTER — Encounter: Payer: BC Managed Care – PPO | Attending: Cardiology | Admitting: *Deleted

## 2020-09-04 DIAGNOSIS — I5022 Chronic systolic (congestive) heart failure: Secondary | ICD-10-CM

## 2020-09-04 NOTE — Progress Notes (Signed)
Initial telephone orientation completed. Diagnosis can be found in [facility] 7/14. EP orientation scheduled for Tuesday 8/2 at 8am.

## 2020-09-08 ENCOUNTER — Encounter: Payer: BC Managed Care – PPO | Attending: Cardiology

## 2020-12-07 DIAGNOSIS — N179 Acute kidney failure, unspecified: Secondary | ICD-10-CM | POA: Insufficient documentation

## 2021-01-16 IMAGING — MG DIGITAL SCREENING UNILAT RIGHT W/ TOMO W/ CAD
4 series · 4 of 12 positions shown · non-contrast
Comparison: Previous exam(s).

CLINICAL DATA: Screening.

EXAM:
DIGITAL SCREENING UNILATERAL RIGHT MAMMOGRAM WITH CAD AND TOMO

[R MLO synth-2D]
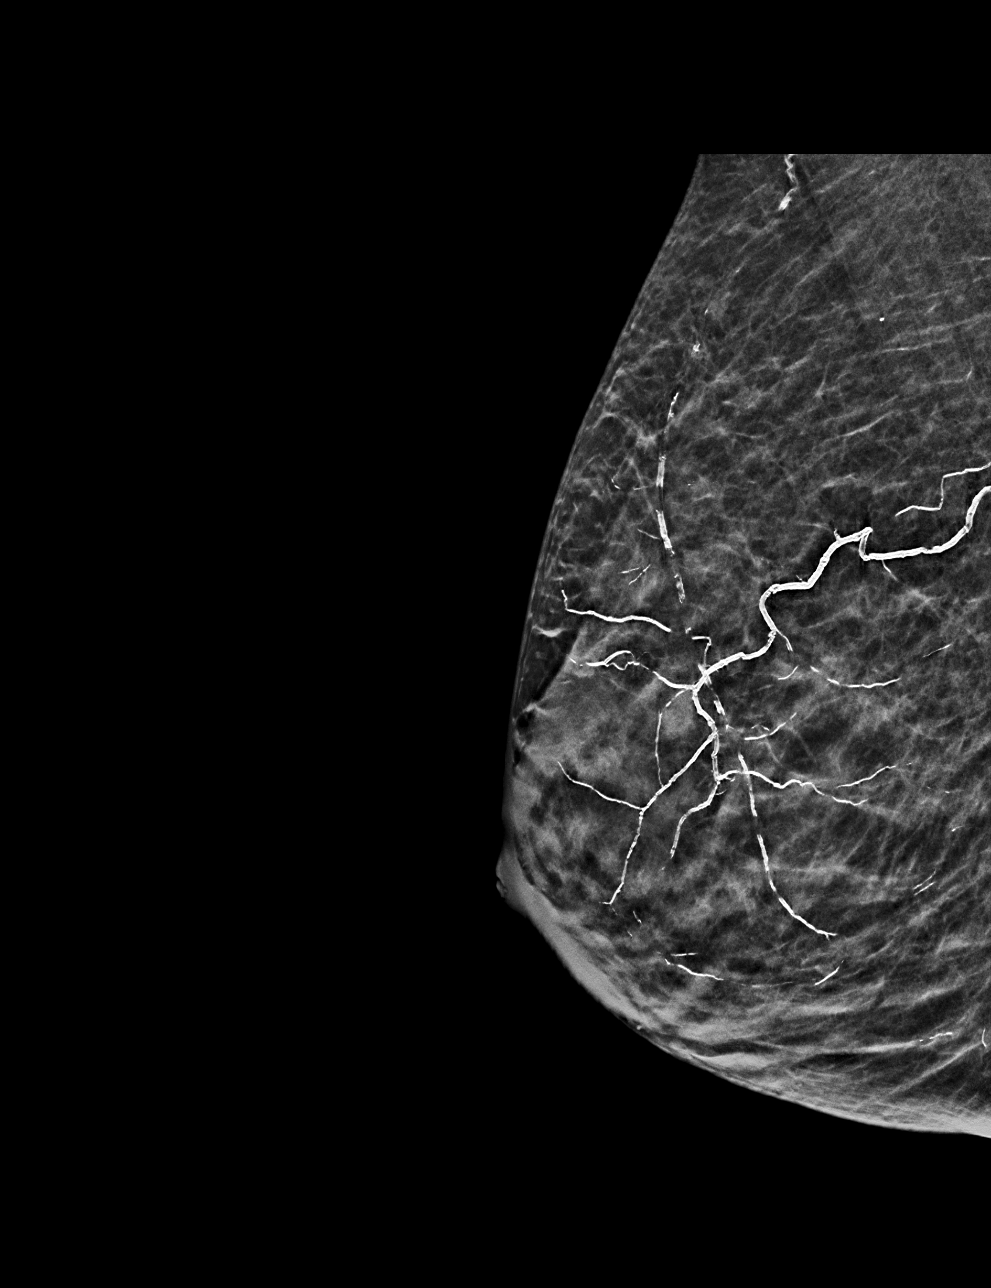

[R CC synth-2D]
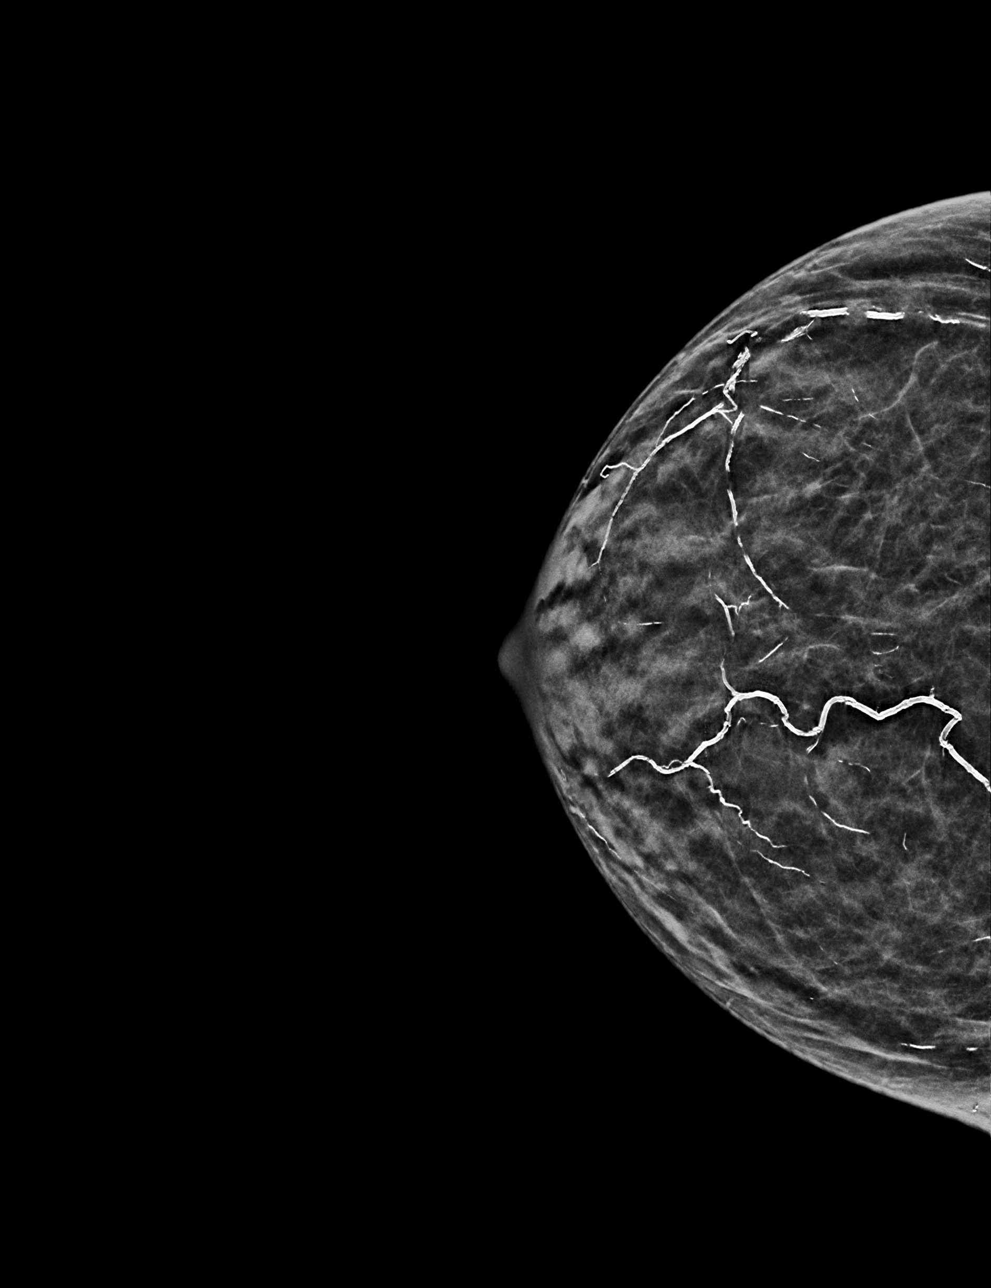

[R CC tomo · tomo slice 19/37.0]
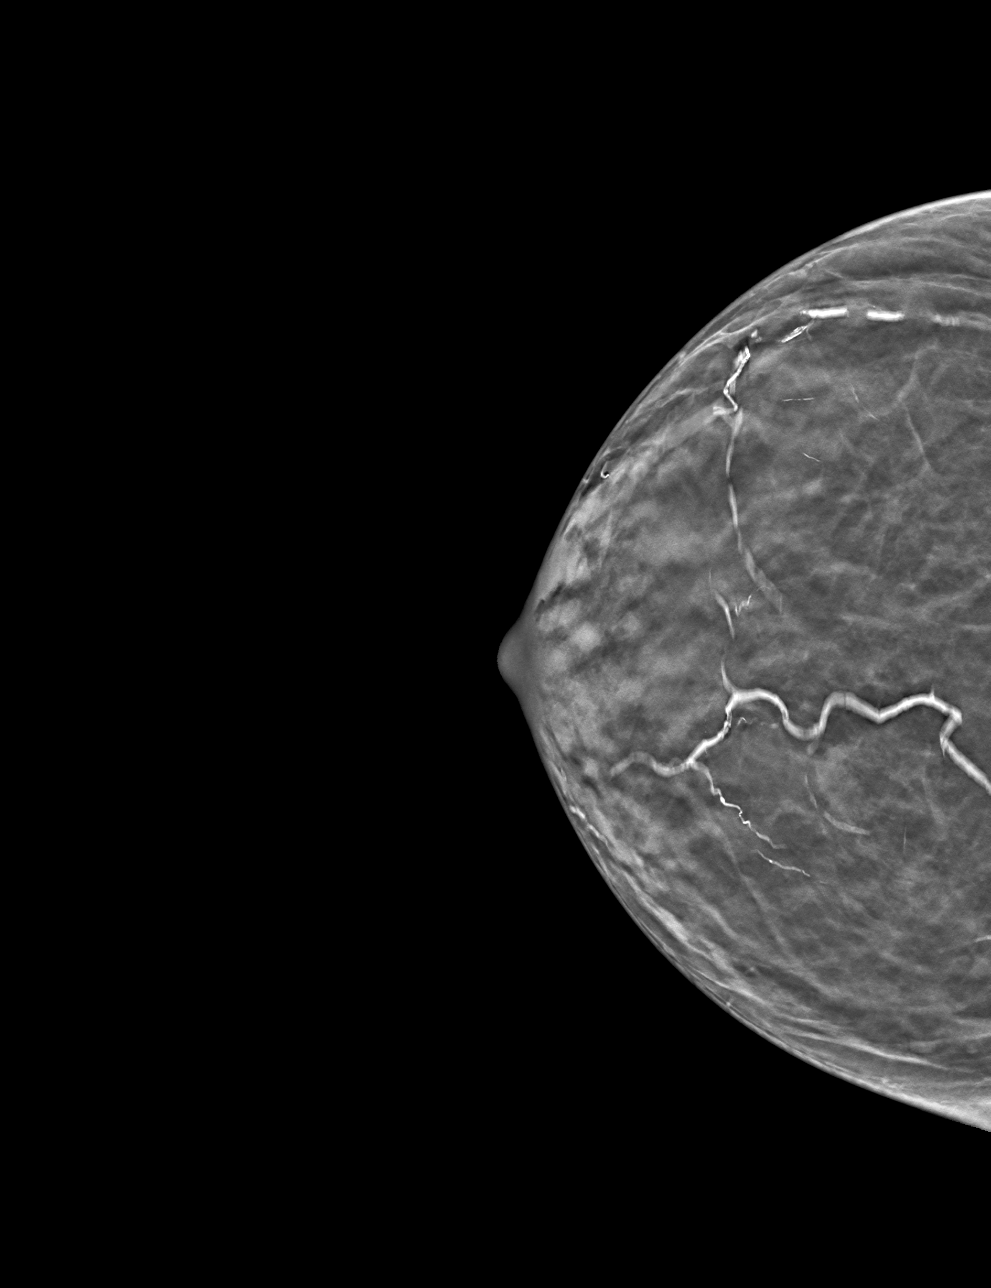

[R MLO tomo · tomo slice 17/34.0]
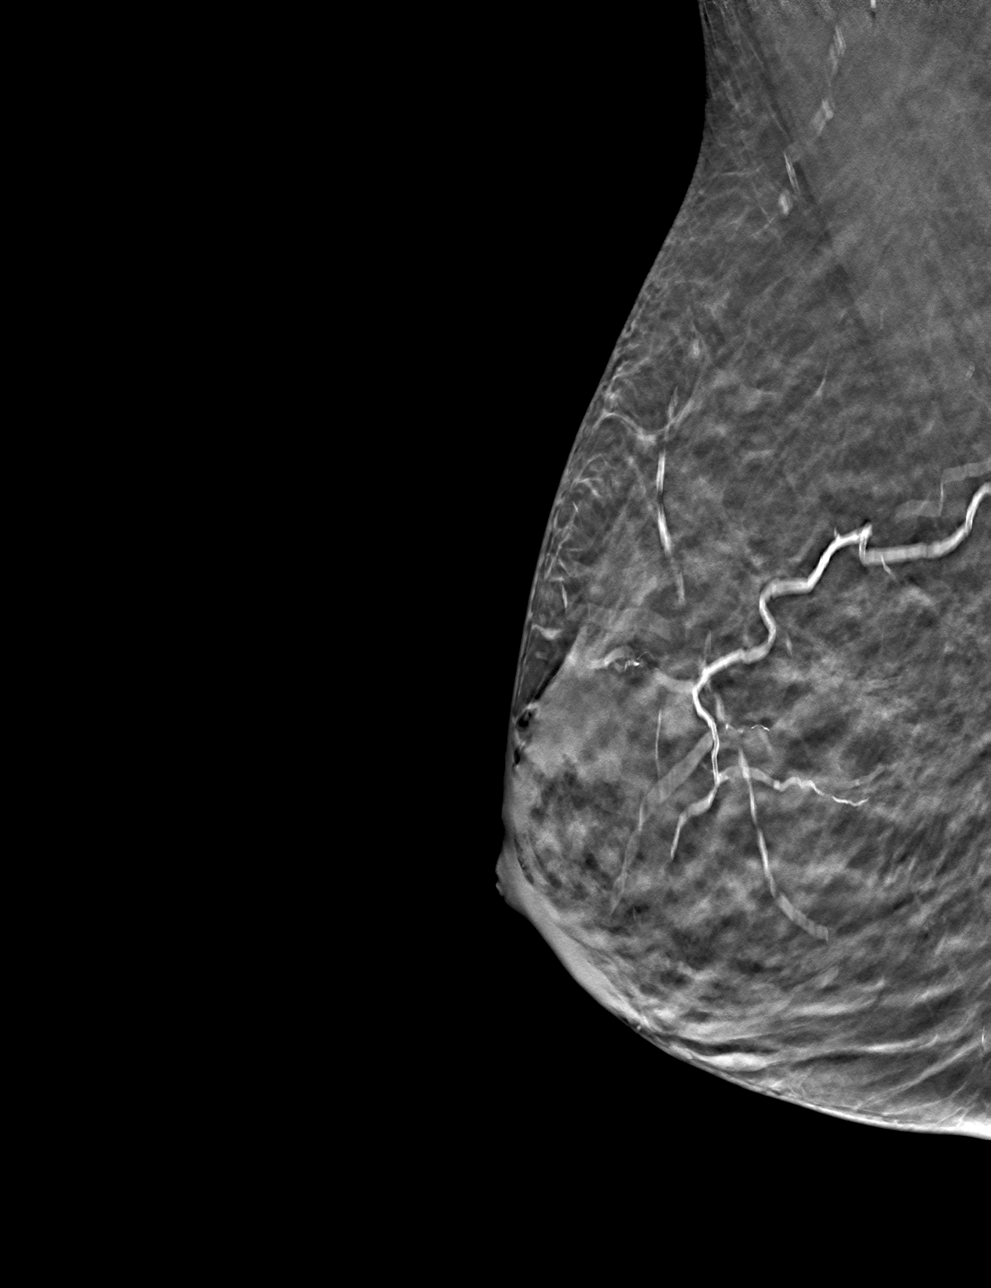

[4 of 12 positions shown; findings below may reference images not displayed]

ACR Breast Density Category c: The breast tissue is heterogeneously
dense, which may obscure small masses.
FINDINGS: There are no findings suspicious for malignancy. Images were
processed with CAD.
IMPRESSION: No mammographic evidence of malignancy. A result letter of this
screening mammogram will be mailed directly to the patient.

RECOMMENDATION:
Screening mammogram in one year. (Code:[id number]-[id number])

BI-RADS CATEGORY  1: Negative.

## 2021-02-25 IMAGING — CR DG KNEE COMPLETE 4+V*R*
1 series · 1 of 1 positions shown · non-contrast
Comparison: None.

CLINICAL DATA: Bilateral knee pain

EXAM:
RIGHT KNEE - COMPLETE 4+ VIEW

[knee lat]
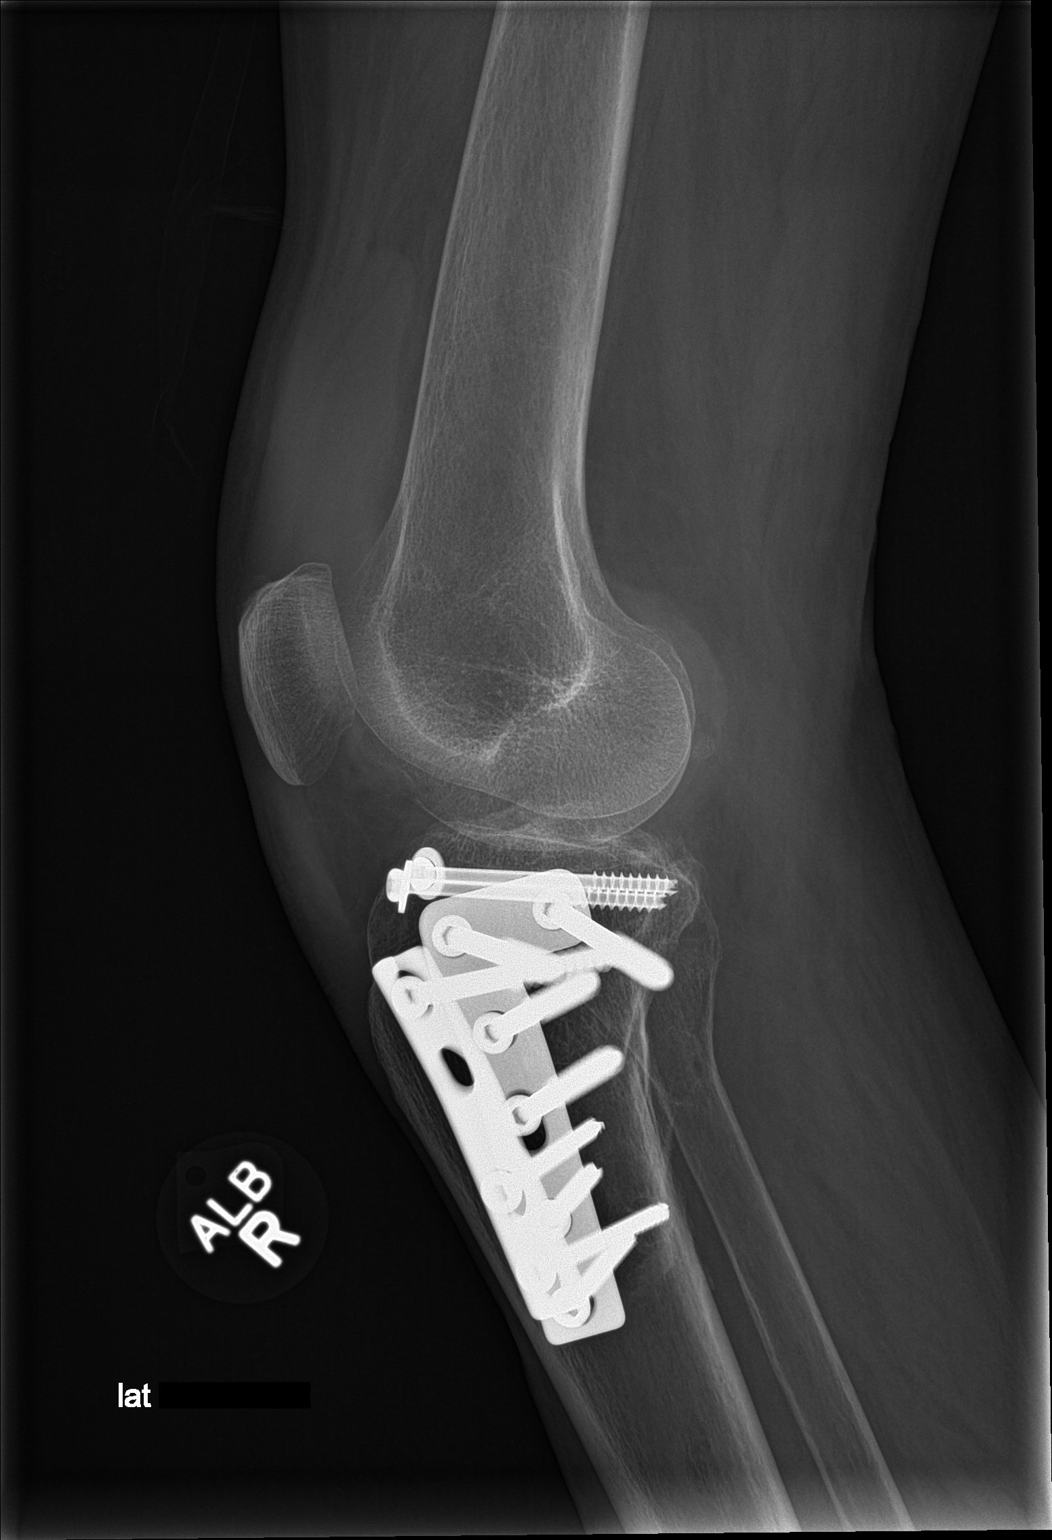

[1 of 1 positions shown; findings below may reference images not displayed]

FINDINGS: Previous surgical reconstruction of the proximal tibia. Moderate to
large joint effusion. Narrowing and irregularity of the lateral
weight-bearing compartment. Mild joint space narrowing of the medial
compartment and patellofemoral joint. No acute finding otherwise.
IMPRESSION: Previous tibial reconstruction. Moderate to large joint effusion.
Degenerative changes, most pronounced in the lateral compartment.

## 2021-03-02 ENCOUNTER — Ambulatory Visit (INDEPENDENT_AMBULATORY_CARE_PROVIDER_SITE_OTHER): Payer: BC Managed Care – PPO

## 2021-03-02 ENCOUNTER — Ambulatory Visit
Admission: EM | Admit: 2021-03-02 | Discharge: 2021-03-02 | Disposition: A | Discharge status: Home / Self Care | Payer: BC Managed Care – PPO | Attending: Emergency Medicine | Admitting: Emergency Medicine

## 2021-03-02 ENCOUNTER — Other Ambulatory Visit: Payer: Self-pay

## 2021-03-02 DIAGNOSIS — S62615A Displaced fracture of proximal phalanx of left ring finger, initial encounter for closed fracture: Secondary | ICD-10-CM | POA: Diagnosis not present

## 2021-03-02 MED ORDER — HYDROCODONE-ACETAMINOPHEN 5-325 MG PO TABS: 1.0000 | ORAL_TABLET | Freq: Four times a day (QID) | ORAL | 0 refills | Status: AC | PRN

## 2021-03-02 NOTE — ED Provider Notes (Signed)
[facility]    CSN: [id number] Arrival date & time: 03/02/21  1057      History   Chief Complaint Chief Complaint  Patient presents with   Fall    HPI Dorothy Mann is a 66 y.o. female.   HPI  37 old female here for evaluation of left hand pain.  That she tripped and fell 2 days ago and caught herself on the edge of a wooden bed with her left hand.  She states that when she caught her self it forced her fourth and fifth finger of her left hand backwards.  She did not have any soreness at the time but gradually became more sore as the day went on.  She is now complaining of pain across her palm as well as on the back of her hand near her fourth and fifth finger.  She denies any numbness or tingling.  And she states she has not seen any bruising.  She states that they do throb.  Patient has some significant swelling to her left fourth finger which is not a result of the injury.  This was present prior and she states that her doctors believe it is tophi from gout.  Past Medical History:  Diagnosis Date   Breast cancer [facility]) 1997   left breast   Cancer ([place])    breast cancer   Hypertension    Personal history of chemotherapy    Personal history of radiation therapy     Patient Active Problem List   Diagnosis Date Noted   Hypertension 87/56/4332   Chronic systolic HF (heart failure) ([place]) 12/07/2017   NICM (nonischemic cardiomyopathy) ([place]) 12/07/2017   History of left breast cancer 12/07/2017    Past Surgical History:  Procedure Laterality Date   BREAST SURGERY Left    FRACTURE SURGERY Right    MASTECTOMY Left 1993    OB History   No obstetric history on file.      Home Medications    Prior to Admission medications   Medication Sig Start Date End Date Taking? Authorizing Provider  alendronate (FOSAMAX) 70 MG tablet Take by mouth. 04/29/16  Yes [provider]  Cholecalciferol 25 MCG (1000 UT) tablet Take by mouth.   Yes [provider]  ferrous sulfate 325 (65 FE) MG tablet Take by mouth.   Yes [provider]  folic acid (FOLVITE) 951 MCG tablet Take by mouth.   Yes [provider]  furosemide (LASIX) 20 MG tablet  03/20/18  Yes [provider]  HYDROcodone-acetaminophen (NORCO/VICODIN) 5-325 MG tablet Take 1-2 tablets by mouth every 6 (six) hours as needed. 03/02/21  Yes Margarette Canada, NP  magnesium oxide (MAG-OX) 400 MG tablet Take by mouth. 09/29/16  Yes [provider]  metoprolol succinate (TOPROL-XL) 100 MG 24 hr tablet Take by mouth. 12/19/17  Yes [provider]  sacubitril-valsartan (ENTRESTO) 49-51 MG Take 1 tablet by mouth 2 (two) times daily. 02/20/19  Yes [provider]  vitamin B-12 (CYANOCOBALAMIN) 100 MCG tablet Take by mouth.   Yes [provider]  gabapentin (NEURONTIN) 100 MG capsule Take by mouth. 03/14/18 03/14/19  [provider]  spironolactone (ALDACTONE) 25 MG tablet Take by mouth. 01/17/17 05/31/19  [provider]  losartan (COZAAR) 100 MG tablet TAKE 1 TABLET(100 MG) BY MOUTH DAILY 04/26/17 09/11/18  [provider]    Family History Family History  Problem Relation Age of Onset   Healthy Mother    Breast cancer Neg  Hx     Social History Social History   Tobacco Use   Smoking status: Never   Smokeless tobacco: Never  Vaping Use   Vaping Use: Never used  Substance Use Topics   Alcohol use: Yes   Drug use: Never     Allergies   Sulfa antibiotics, Aspirin, and Ramipril   Review of Systems Review of Systems  Musculoskeletal:  Positive for arthralgias and joint swelling.  Skin:  Positive for color change.  Neurological:  Negative for weakness and numbness.  Hematological: Negative.   Psychiatric/Behavioral: Negative.      Physical Exam Triage Vital Signs ED Triage Vitals  Enc Vitals Group     BP --      Pulse --      Resp --      Temp --      Temp src --      SpO2 --       Weight 03/02/21 1115 112 lb (50.8 kg)     Height 03/02/21 1115 5\' 4"  (1.626 m)     Head Circumference --      Peak Flow --      Pain Score 03/02/21 1113 10     Pain Loc --      Pain Edu? --      Excl. in [place]? --    No data found.  Updated Vital Signs BP 107/90 (BP Location: Right Arm)    Pulse 81    Temp 97.8 F (36.6 C) (Oral)    Resp 18    Ht 5\' 4"  (1.626 m)    Wt 112 lb (50.8 kg)    SpO2 100%    BMI 19.22 kg/m   Visual Acuity Right Eye Distance:   Left Eye Distance:   Bilateral Distance:    Right Eye Near:   Left Eye Near:    Bilateral Near:     Physical Exam Vitals and nursing note reviewed.  Constitutional:      General: She is not in acute distress.    Appearance: Normal appearance. She is not ill-appearing.  HENT:     Head: Normocephalic and atraumatic.  Musculoskeletal:        General: Swelling and tenderness present. No deformity.  Skin:    General: Skin is warm and dry.     Capillary Refill: Capillary refill takes less than 2 seconds.     Findings: Bruising present.  Neurological:     General: No focal deficit present.     Mental Status: She is alert and oriented to person, place, and time.     Sensory: No sensory deficit.  Psychiatric:        Mood and Affect: Mood normal.        Behavior: Behavior normal.        Thought Content: Thought content normal.        Judgment: Judgment normal.     UC Treatments / Results  Labs (all labs ordered are listed, but only abnormal results are displayed) Labs Reviewed - No data to display  EKG   Radiology DG Hand Complete Left  Result Date: 03/02/2021 CLINICAL DATA:  Left hand pain after fall. EXAM: LEFT HAND - COMPLETE 3+ VIEW COMPARISON:  September 11, 2018. FINDINGS: There is diffuse swelling of the left fourth finger, particularly around the proximal interphalangeal joint. This may be related to the patient's history of gout. There does appear to be probable moderately displaced fracture involving the distal  portion of fourth proximal  phalanx, best seen on lateral radiograph. There also appears to be some degree of lateral dislocation of the joint. IMPRESSION: Probable moderately displaced fracture involving distal portion of fourth proximal phalanx, with some degree of associated dislocation. Diffuse soft tissue swelling of the fourth finger is noted, particularly around the proximal interphalangeal joint, consistent with a history of gout. Electronically Signed   By: Marijo Conception M.D.   On: 03/02/2021 12:12    Procedures Procedures (including critical care time)  Medications Ordered in UC Medications - No data to display  Initial Impression / Assessment and Plan / UC Course  I have reviewed the triage vital signs and the nursing notes.  Pertinent labs & imaging results that were available during my care of the patient were reviewed by me and considered in my medical decision making (see chart for details).  Patient is a very pleasant, nontoxic-appearing 66 year old female here for evaluation of pain in her left fourth and fifth finger after suffering a fall 2 days ago.  She has been keeping the finger wrapped up in a large Ace wrap to splint them to prevent movement as this causes pain.  She denies any numbness or tingling and she states that she has not seen any bruising.  On exam patient does have bruising over the MCP joints of the left fourth and fifth finger on the dorsal aspect of the hand and bruising over the distal metacarpals of the fourth and fifth finger across the palm of the hand.  She has no tenderness with palpation of the interphalangeal joints or the phalanxes of the fingers.  She does have pain when palpating the MCP joint and distal metacarpals of both fingers.  There is no pain with palpation of the remainder of the metacarpals, carpal bones, or wrist.  We will obtain radiograph of left hand to rule out bony injury.  Left hand films independently reviewed and evaluated by me.  No  evidence of fracture or dislocation.  Patient has significant degeneration at the PIP joint of the left fourth finger. Radiology overread is pending. Radiology impression is probable moderately displaced fracture involving the distal portion of the fourth proximal phalanx with some degree of associated dislocation.  Diffuse soft tissue swelling of the fourth finger is noted tickly around the proximal interphalangeal joint consistent with a history of gout.  This is not where the patient is having any pain and she does not have any pain with compression of the PIP joint of the fourth finger.  We will place patient in ulnar gutter splint appositional function and have her follow-up with orthopedics.  I will discharge patient home with prescription for hydrocodone that she can use for severe pain and she may continue to use Tylenol as needed for mild to moderate pain.  I will caution her about consuming more than 4000 mg of Tylenol a day.  Patient has used hydrocodone in the past without difficulty.  Upon delivering the x-ray findings to the patient the patient's significant other also asked that it be noted that patient is now having pain in her back and muscle tension.  She is also requesting a work note.   Final Clinical Impressions(s) / UC Diagnoses   Final diagnoses:  Closed displaced fracture of proximal phalanx of left ring finger, initial encounter     Discharge Instructions      Wear the splint at all times to protect your fingers from further injury.  Elevate your left hand is much as possible  to help with swelling and decrease pain.  You may apply ice over top of your splint for 20 minutes at a time 2-3 times a day to help with pain and swelling.  Please call the orthopedist that has been dealing with your gout affected finger to make an appointment for follow-up as this is where the radiologist thinks there may be a fracture.  Continue to use Tylenol for mild to moderate pain and  use the Norco as needed for severe pain.  Both of these medications contain Tylenol so be mindful not to take more than 4000 mg of Tylenol a day.       ED Prescriptions     Medication Sig Dispense Auth. Provider   HYDROcodone-acetaminophen (NORCO/VICODIN) 5-325 MG tablet Take 1-2 tablets by mouth every 6 (six) hours as needed. 21 tablet Margarette Canada, NP      I have reviewed the PDMP during this encounter.   Margarette Canada, NP 03/02/21 1248

## 2021-03-02 NOTE — ED Triage Notes (Signed)
Pt states that she fell on 02/28/21. Pt tripped over a step and landed on a the edge of a wooden bed and used her left hand to catch herself. Pt states that her hand gradually got sore after the fall.  Pt states that the pain is in the ring and pinky finger near the knuckles and along the back of the left hand and that it hurts to move her fingers. Pt has her hand wrapped to prevent movement.

## 2021-03-02 NOTE — Discharge Instructions (Addendum)
Wear the splint at all times to protect your fingers from further injury.  Elevate your left hand is much as possible to help with swelling and decrease pain.  You may apply ice over top of your splint for 20 minutes at a time 2-3 times a day to help with pain and swelling.  Please call the orthopedist that has been dealing with your gout affected finger to make an appointment for follow-up as this is where the radiologist thinks there may be a fracture.  Continue to use Tylenol for mild to moderate pain and use the Norco as needed for severe pain.  Both of these medications contain Tylenol so be mindful not to take more than 4000 mg of Tylenol a day.

## 2021-03-16 DIAGNOSIS — E876 Hypokalemia: Secondary | ICD-10-CM | POA: Insufficient documentation

## 2021-05-12 ENCOUNTER — Encounter: Payer: BC Managed Care – PPO | Attending: Cardiovascular Disease | Admitting: *Deleted

## 2021-05-12 ENCOUNTER — Encounter: Payer: Self-pay | Admitting: *Deleted

## 2021-05-12 DIAGNOSIS — I5022 Chronic systolic (congestive) heart failure: Secondary | ICD-10-CM

## 2021-05-12 NOTE — Progress Notes (Signed)
Initial telephone orientation completed. Diagnosis can be found in [facility] 3/24. EP orientation scheduled for Tuesday 4/11 at 8am. ?

## 2021-05-18 ENCOUNTER — Ambulatory Visit: Payer: BC Managed Care – PPO

## 2021-06-17 ENCOUNTER — Encounter: Payer: BC Managed Care – PPO | Attending: Cardiovascular Disease

## 2021-09-15 ENCOUNTER — Encounter: Payer: BC Managed Care – PPO | Attending: Cardiovascular Disease

## 2021-09-15 DIAGNOSIS — I5022 Chronic systolic (congestive) heart failure: Secondary | ICD-10-CM | POA: Insufficient documentation

## 2021-09-15 NOTE — Progress Notes (Signed)
Virtual orientation call completed today. She has an appointment on Date: 09/21/21  for EP eval and gym Orientation.  Documentation of diagnosis can be found in [facility] Date: 08/18/21 .

## 2021-09-21 VITALS — Ht 65.0 in | Wt 101.7 lb

## 2021-09-21 DIAGNOSIS — I5022 Chronic systolic (congestive) heart failure: Secondary | ICD-10-CM

## 2021-09-21 NOTE — Patient Instructions (Signed)
Patient Instructions  Patient Details  Name: Dorothy Mann MRN: [id number] Date of Birth: Jul 23, 1955 Referring Provider:  Rolene Course, MD  Below are your personal goals for exercise, nutrition, and risk factors. Our goal is to help you stay on track towards obtaining and maintaining these goals. We will be discussing your progress on these goals with you throughout the program.  Initial Exercise Prescription:  Initial Exercise Prescription - 09/21/21 1100       Date of Initial Exercise RX and Referring Provider   Date 09/21/21    Referring Provider Byku      Oxygen   Maintain Oxygen Saturation 88% or higher      Treadmill   MPH 1.3    Grade 0    Minutes 15    METs 2      NuStep   Level 1    SPM 80    Minutes 15    METs 2.39      REL-XR   Level 1    Minutes 15    METs 2.39      Biostep-RELP   Level 1    SPM 60    Minutes 15    METs 2.39      Track   Laps 20    Minutes 15    METs 2.39      Prescription Details   Frequency (times per week) 3    Duration Progress to 30 minutes of continuous aerobic without signs/symptoms of physical distress      Intensity   THRR 40-80% of Max Heartrate 108-140    Ratings of Perceived Exertion 11-15    Perceived Dyspnea 0-4      Progression   Progression Continue to progress workloads to maintain intensity without signs/symptoms of physical distress.      Resistance Training   Training Prescription Yes    Weight 2 lb    Reps 10-15             Exercise Goals: Frequency: Be able to perform aerobic exercise two to three times per week in program working toward 2-5 days per week of home exercise.  Intensity: Work with a perceived exertion of 11 (fairly light) - 15 (hard) while following your exercise prescription.  We will make changes to your prescription with you as you progress through the program.   Duration: Be able to do 30 to 45 minutes of continuous aerobic exercise in addition to a 5 minute warm-up and a 5  minute cool-down routine.   Nutrition Goals: Your personal nutrition goals will be established when you do your nutrition analysis with the dietician.  The following are general nutrition guidelines to follow: Cholesterol < '200mg'$ /day Sodium < '1500mg'$ /day Fiber: Women over 50 yrs - 21 grams per day  Personal Goals:  Personal Goals and Risk Factors at Admission - 09/21/21 1351       Core Components/Risk Factors/Patient Goals on Admission    Weight Management Weight Gain;Yes    Intervention Weight Management: Develop a combined nutrition and exercise program designed to reach desired caloric intake, while maintaining appropriate intake of nutrient and fiber, sodium and fats, and appropriate energy expenditure required for the weight goal.;Weight Management: Provide education and appropriate resources to help participant work on and attain dietary goals.    Admit Weight 101 lb 11.2 oz (46.1 kg)    Goal Weight: Short Term 105 lb (47.6 kg)    Goal Weight: Long Term 110 lb (49.9 kg)    Expected Outcomes  Short Term: Continue to assess and modify interventions until short term weight is achieved;Long Term: Adherence to nutrition and physical activity/exercise program aimed toward attainment of established weight goal;Weight Gain: Understanding of general recommendations for a high calorie, high protein meal plan that promotes weight gain by distributing calorie intake throughout the day with the consumption for 4-5 meals, snacks, and/or supplements;Understanding of distribution of calorie intake throughout the day with the consumption of 4-5 meals/snacks    Heart Failure Yes    Intervention Provide a combined exercise and nutrition program that is supplemented with education, support and counseling about heart failure. Directed toward relieving symptoms such as shortness of breath, decreased exercise tolerance, and extremity edema.    Expected Outcomes Short term: Attendance in program 2-3 days a week  with increased exercise capacity. Reported lower sodium intake. Reported increased fruit and vegetable intake. Reports medication compliance.;Improve functional capacity of life;Short term: Daily weights obtained and reported for increase. Utilizing diuretic protocols set by physician.;Long term: Adoption of self-care skills and reduction of barriers for early signs and symptoms recognition and intervention leading to self-care maintenance.    Hypertension Yes    Intervention Provide education on lifestyle modifcations including regular physical activity/exercise, weight management, moderate sodium restriction and increased consumption of fresh fruit, vegetables, and low fat dairy, alcohol moderation, and smoking cessation.;Monitor prescription use compliance.    Expected Outcomes Short Term: Continued assessment and intervention until BP is < 140/11m HG in hypertensive participants. < 130/819mHG in hypertensive participants with diabetes, heart failure or chronic kidney disease.;Long Term: Maintenance of blood pressure at goal levels.             Tobacco Use Initial Evaluation: Social History   Tobacco Use  Smoking Status Never  Smokeless Tobacco Never    Exercise Goals and Review:  Exercise Goals     Row Name 09/21/21 1336             Exercise Goals   Increase Physical Activity Yes       Intervention Provide advice, education, support and counseling about physical activity/exercise needs.;Develop an individualized exercise prescription for aerobic and resistive training based on initial evaluation findings, risk stratification, comorbidities and participant's personal goals.       Expected Outcomes Short Term: Attend rehab on a regular basis to increase amount of physical activity.;Long Term: Add in home exercise to make exercise part of routine and to increase amount of physical activity.;Long Term: Exercising regularly at least 3-5 days a week.       Increase Strength and  Stamina Yes       Intervention Provide advice, education, support and counseling about physical activity/exercise needs.;Develop an individualized exercise prescription for aerobic and resistive training based on initial evaluation findings, risk stratification, comorbidities and participant's personal goals.       Expected Outcomes Short Term: Increase workloads from initial exercise prescription for resistance, speed, and METs.;Short Term: Perform resistance training exercises routinely during rehab and add in resistance training at home;Long Term: Improve cardiorespiratory fitness, muscular endurance and strength as measured by increased METs and functional capacity (6MWT)       Able to understand and use rate of perceived exertion (RPE) scale Yes       Intervention Provide education and explanation on how to use RPE scale       Expected Outcomes Short Term: Able to use RPE daily in rehab to express subjective intensity level;Long Term:  Able to use RPE to guide intensity level when  exercising independently       Able to understand and use Dyspnea scale Yes       Intervention Provide education and explanation on how to use Dyspnea scale       Expected Outcomes Short Term: Able to use Dyspnea scale daily in rehab to express subjective sense of shortness of breath during exertion;Long Term: Able to use Dyspnea scale to guide intensity level when exercising independently       Knowledge and understanding of Target Heart Rate Range (THRR) Yes       Intervention Provide education and explanation of THRR including how the numbers were predicted and where they are located for reference       Expected Outcomes Short Term: Able to state/look up THRR;Short Term: Able to use daily as guideline for intensity in rehab;Long Term: Able to use THRR to govern intensity when exercising independently       Able to check pulse independently Yes       Intervention Review the importance of being able to check your own  pulse for safety during independent exercise;Provide education and demonstration on how to check pulse in carotid and radial arteries.       Expected Outcomes Short Term: Able to explain why pulse checking is important during independent exercise;Long Term: Able to check pulse independently and accurately       Understanding of Exercise Prescription Yes       Intervention Provide education, explanation, and written materials on patient's individual exercise prescription       Expected Outcomes Long Term: Able to explain home exercise prescription to exercise independently;Short Term: Able to explain program exercise prescription                Copy of goals given to participant.

## 2021-09-21 NOTE — Progress Notes (Signed)
Cardiac Individual Treatment Plan  Patient Details  Name: Dorothy Mann MRN: [id number] Date of Birth: 1955-04-27 Referring Provider:   Flowsheet Row Cardiac Rehab from 09/21/2021 in [facility] Cardiac and Pulmonary Rehab  Referring Provider Byku       Initial Encounter Date:  Flowsheet Row Cardiac Rehab from 09/21/2021 in [facility] Cardiac and Pulmonary Rehab  Date 09/21/21       Visit Diagnosis: Heart failure, chronic systolic (HCC)  Chronic systolic HF (heart failure) ([place])  Patient's Home Medications on Admission:  Current Outpatient Medications:    alendronate (FOSAMAX) 70 MG tablet, Take by mouth., Disp: , Rfl:    allopurinol (ZYLOPRIM) 100 MG tablet, Take 100 mg by mouth daily., Disp: , Rfl:    Cholecalciferol 25 MCG (1000 UT) tablet, Take by mouth., Disp: , Rfl:    colchicine 0.6 MG tablet, Take 0.6 mg by mouth daily., Disp: , Rfl:    ferrous sulfate 325 (65 FE) MG tablet, Take by mouth. (Patient not taking: Reported on 09/15/2021), Disp: , Rfl:    folic acid (FOLVITE) 347 MCG tablet, Take by mouth., Disp: , Rfl:    furosemide (LASIX) 20 MG tablet, 40 mg., Disp: , Rfl:    gabapentin (NEURONTIN) 100 MG capsule, Take by mouth., Disp: , Rfl:    gabapentin (NEURONTIN) 100 MG capsule, as needed., Disp: , Rfl:    HYDROcodone-acetaminophen (NORCO/VICODIN) 5-325 MG tablet, Take 1-2 tablets by mouth every 6 (six) hours as needed. (Patient not taking: Reported on 09/15/2021), Disp: 21 tablet, Rfl: 0   JARDIANCE 10 MG TABS tablet, Take 10 mg by mouth daily., Disp: , Rfl:    loperamide (IMODIUM) 2 MG capsule, Take by mouth as needed., Disp: , Rfl:    magnesium oxide (MAG-OX) 400 MG tablet, Take 800 mg by mouth., Disp: , Rfl:    metoprolol succinate (TOPROL-XL) 50 MG 24 hr tablet, Take by mouth., Disp: , Rfl:    potassium chloride SA (KLOR-CON M) 20 MEQ tablet, Take 40 mEq by mouth daily. (Patient not taking: Reported on 09/15/2021), Disp: , Rfl:    sacubitril-valsartan (ENTRESTO) 24-26 MG, Take 1  tablet by mouth 2 (two) times daily., Disp: , Rfl:    spironolactone (ALDACTONE) 25 MG tablet, Take by mouth., Disp: , Rfl:    spironolactone (ALDACTONE) 25 MG tablet, Take 0.5 tablets by mouth daily., Disp: , Rfl:    vitamin B-12 (CYANOCOBALAMIN) 100 MCG tablet, Take by mouth., Disp: , Rfl:   Past Medical History: Past Medical History:  Diagnosis Date   Breast cancer ([place]) 1997   left breast   Cancer ([place])    breast cancer   Hypertension    Personal history of chemotherapy    Personal history of radiation therapy     Tobacco Use: Social History   Tobacco Use  Smoking Status Never  Smokeless Tobacco Never    Labs: Review Flowsheet        No data to display           Exercise Target Goals: Exercise Program Goal: Individual exercise prescription set using results from initial 6 min walk test and THRR while considering  patient's activity barriers and safety.   Exercise Prescription Goal: Initial exercise prescription builds to 30-45 minutes a day of aerobic activity, 2-3 days per week.  Home exercise guidelines will be given to patient during program as part of exercise prescription that the participant will acknowledge.   Education: Aerobic Exercise: - Group verbal and visual presentation on the components of exercise  prescription. Introduces F.I.T.T principle from ACSM for exercise prescriptions.  Reviews F.I.T.T. principles of aerobic exercise including progression. Written material given at graduation. Flowsheet Row Cardiac Rehab from 09/21/2021 in [facility] Cardiac and Pulmonary Rehab  Education need identified 09/21/21       Education: Resistance Exercise: - Group verbal and visual presentation on the components of exercise prescription. Introduces F.I.T.T principle from ACSM for exercise prescriptions  Reviews F.I.T.T. principles of resistance exercise including progression. Written material given at graduation.    Education: Exercise & Equipment Safety: -  Individual verbal instruction and demonstration of equipment use and safety with use of the equipment. Flowsheet Row Cardiac Rehab from 09/21/2021 in [facility] Cardiac and Pulmonary Rehab  Education need identified 09/21/21  Date 09/21/21  Educator NT  Instruction Review Code 1- Verbalizes Understanding       Education: Exercise Physiology & General Exercise Guidelines: - Group verbal and written instruction with models to review the exercise physiology of the cardiovascular system and associated critical values. Provides general exercise guidelines with specific guidelines to those with heart or lung disease.  Flowsheet Row Cardiac Rehab from 09/21/2021 in [facility] Cardiac and Pulmonary Rehab  Education need identified 09/21/21       Education: Flexibility, Balance, Mind/Body Relaxation: - Group verbal and visual presentation with interactive activity on the components of exercise prescription. Introduces F.I.T.T principle from ACSM for exercise prescriptions. Reviews F.I.T.T. principles of flexibility and balance exercise training including progression. Also discusses the mind body connection.  Reviews various relaxation techniques to help reduce and manage stress (i.e. Deep breathing, progressive muscle relaxation, and visualization). Balance handout provided to take home. Written material given at graduation.   Activity Barriers & Risk Stratification:  Activity Barriers & Cardiac Risk Stratification - 09/21/21 1039       Activity Barriers & Cardiac Risk Stratification   Activity Barriers Joint Problems;Balance Concerns;Muscular Weakness;Deconditioning    Cardiac Risk Stratification High             6 Minute Walk:  6 Minute Walk     Row Name 09/21/21 1036         6 Minute Walk   Phase Initial     Distance 700 feet     Walk Time 5.83 minutes     # of Rest Breaks 1     MPH 1.36     METS 2.39     RPE 11     Perceived Dyspnea  0     VO2 Peak 8.36     Symptoms No     Resting  HR 76 bpm     Resting BP 84/58     Resting Oxygen Saturation  100 %     Exercise Oxygen Saturation  during 6 min walk 100 %     Max Ex. HR 90 bpm     Max Ex. BP 96/60     2 Minute Post BP 82/54              Oxygen Initial Assessment:   Oxygen Re-Evaluation:   Oxygen Discharge (Final Oxygen Re-Evaluation):   Initial Exercise Prescription:  Initial Exercise Prescription - 09/21/21 1100       Date of Initial Exercise RX and Referring Provider   Date 09/21/21    Referring Provider Byku      Oxygen   Maintain Oxygen Saturation 88% or higher      Treadmill   MPH 1.3    Grade 0    Minutes 15  METs 2      NuStep   Level 1    SPM 80    Minutes 15    METs 2.39      REL-XR   Level 1    Minutes 15    METs 2.39      Biostep-RELP   Level 1    SPM 60    Minutes 15    METs 2.39      Track   Laps 20    Minutes 15    METs 2.39      Prescription Details   Frequency (times per week) 3    Duration Progress to 30 minutes of continuous aerobic without signs/symptoms of physical distress      Intensity   THRR 40-80% of Max Heartrate 108-140    Ratings of Perceived Exertion 11-15    Perceived Dyspnea 0-4      Progression   Progression Continue to progress workloads to maintain intensity without signs/symptoms of physical distress.      Resistance Training   Training Prescription Yes    Weight 2 lb    Reps 10-15             Perform Capillary Blood Glucose checks as needed.  Exercise Prescription Changes:   Exercise Prescription Changes     Row Name 09/21/21 1100             Response to Exercise   Blood Pressure (Admit) 84/58       Blood Pressure (Exercise) 96/60       Blood Pressure (Exit) 82/54       Heart Rate (Admit) 76 bpm       Heart Rate (Exercise) 90 bpm       Heart Rate (Exit) 74 bpm       Oxygen Saturation (Admit) 100 %       Oxygen Saturation (Exercise) 100 %       Oxygen Saturation (Exit) 100 %       Rating of Perceived  Exertion (Exercise) 11       Perceived Dyspnea (Exercise) 0       Symptoms none       Comments 6MWT results       Duration Progress to 30 minutes of  aerobic without signs/symptoms of physical distress       Intensity THRR unchanged                Exercise Comments:   Exercise Goals and Review:   Exercise Goals     Row Name 09/21/21 1336             Exercise Goals   Increase Physical Activity Yes       Intervention Provide advice, education, support and counseling about physical activity/exercise needs.;Develop an individualized exercise prescription for aerobic and resistive training based on initial evaluation findings, risk stratification, comorbidities and participant's personal goals.       Expected Outcomes Short Term: Attend rehab on a regular basis to increase amount of physical activity.;Long Term: Add in home exercise to make exercise part of routine and to increase amount of physical activity.;Long Term: Exercising regularly at least 3-5 days a week.       Increase Strength and Stamina Yes       Intervention Provide advice, education, support and counseling about physical activity/exercise needs.;Develop an individualized exercise prescription for aerobic and resistive training based on initial evaluation findings, risk stratification, comorbidities and participant's personal goals.  Expected Outcomes Short Term: Increase workloads from initial exercise prescription for resistance, speed, and METs.;Short Term: Perform resistance training exercises routinely during rehab and add in resistance training at home;Long Term: Improve cardiorespiratory fitness, muscular endurance and strength as measured by increased METs and functional capacity (6MWT)       Able to understand and use rate of perceived exertion (RPE) scale Yes       Intervention Provide education and explanation on how to use RPE scale       Expected Outcomes Short Term: Able to use RPE daily in rehab to  express subjective intensity level;Long Term:  Able to use RPE to guide intensity level when exercising independently       Able to understand and use Dyspnea scale Yes       Intervention Provide education and explanation on how to use Dyspnea scale       Expected Outcomes Short Term: Able to use Dyspnea scale daily in rehab to express subjective sense of shortness of breath during exertion;Long Term: Able to use Dyspnea scale to guide intensity level when exercising independently       Knowledge and understanding of Target Heart Rate Range (THRR) Yes       Intervention Provide education and explanation of THRR including how the numbers were predicted and where they are located for reference       Expected Outcomes Short Term: Able to state/look up THRR;Short Term: Able to use daily as guideline for intensity in rehab;Long Term: Able to use THRR to govern intensity when exercising independently       Able to check pulse independently Yes       Intervention Review the importance of being able to check your own pulse for safety during independent exercise;Provide education and demonstration on how to check pulse in carotid and radial arteries.       Expected Outcomes Short Term: Able to explain why pulse checking is important during independent exercise;Long Term: Able to check pulse independently and accurately       Understanding of Exercise Prescription Yes       Intervention Provide education, explanation, and written materials on patient's individual exercise prescription       Expected Outcomes Long Term: Able to explain home exercise prescription to exercise independently;Short Term: Able to explain program exercise prescription                Exercise Goals Re-Evaluation :   Discharge Exercise Prescription (Final Exercise Prescription Changes):  Exercise Prescription Changes - 09/21/21 1100       Response to Exercise   Blood Pressure (Admit) 84/58    Blood Pressure (Exercise) 96/60     Blood Pressure (Exit) 82/54    Heart Rate (Admit) 76 bpm    Heart Rate (Exercise) 90 bpm    Heart Rate (Exit) 74 bpm    Oxygen Saturation (Admit) 100 %    Oxygen Saturation (Exercise) 100 %    Oxygen Saturation (Exit) 100 %    Rating of Perceived Exertion (Exercise) 11    Perceived Dyspnea (Exercise) 0    Symptoms none    Comments 6MWT results    Duration Progress to 30 minutes of  aerobic without signs/symptoms of physical distress    Intensity THRR unchanged             Nutrition:  Target Goals: Understanding of nutrition guidelines, daily intake of sodium '1500mg'$ , cholesterol '200mg'$ , calories 30% from fat and 7% or less from saturated  fats, daily to have 5 or more servings of fruits and vegetables.  Education: All About Nutrition: -Group instruction provided by verbal, written material, interactive activities, discussions, models, and posters to present general guidelines for heart healthy nutrition including fat, fiber, MyPlate, the role of sodium in heart healthy nutrition, utilization of the nutrition label, and utilization of this knowledge for meal planning. Follow up email sent as well. Written material given at graduation.   Biometrics:  Pre Biometrics - 09/21/21 1038       Pre Biometrics   Height '5\' 5"'$  (1.651 m)    Weight 101 lb 11.2 oz (46.1 kg)    BMI (Calculated) 16.92    Single Leg Stand 3.8 seconds              Nutrition Therapy Plan and Nutrition Goals:  Nutrition Therapy & Goals - 09/21/21 1337       Intervention Plan   Intervention Prescribe, educate and counsel regarding individualized specific dietary modifications aiming towards targeted core components such as weight, hypertension, lipid management, diabetes, heart failure and other comorbidities.    Expected Outcomes Short Term Goal: Understand basic principles of dietary content, such as calories, fat, sodium, cholesterol and nutrients.;Long Term Goal: Adherence to prescribed nutrition  plan.;Short Term Goal: A plan has been developed with personal nutrition goals set during dietitian appointment.             Nutrition Assessments:  MEDIFICTS Score Key: ?70 Need to make dietary changes  40-70 Heart Healthy Diet ? 40 Therapeutic Level Cholesterol Diet  Flowsheet Row Cardiac Rehab from 09/21/2021 in [facility] Cardiac and Pulmonary Rehab  Picture Your Plate Total Score on Admission 52      Picture Your Plate Scores: <16 Unhealthy dietary pattern with much room for improvement. 41-50 Dietary pattern unlikely to meet recommendations for good health and room for improvement. 51-60 More healthful dietary pattern, with some room for improvement.  >60 Healthy dietary pattern, although there may be some specific behaviors that could be improved.    Nutrition Goals Re-Evaluation:   Nutrition Goals Discharge (Final Nutrition Goals Re-Evaluation):   Psychosocial: Target Goals: Acknowledge presence or absence of significant depression and/or stress, maximize coping skills, provide positive support system. Participant is able to verbalize types and ability to use techniques and skills needed for reducing stress and depression.   Education: Stress, Anxiety, and Depression - Group verbal and visual presentation to define topics covered.  Reviews how body is impacted by stress, anxiety, and depression.  Also discusses healthy ways to reduce stress and to treat/manage anxiety and depression.  Written material given at graduation.   Education: Sleep Hygiene -Provides group verbal and written instruction about how sleep can affect your health.  Define sleep hygiene, discuss sleep cycles and impact of sleep habits. Review good sleep hygiene tips.    Initial Review & Psychosocial Screening:  Initial Psych Review & Screening - 09/15/21 1456       Initial Review   Current issues with Current Stress Concerns    Source of Stress Concerns Chronic Illness    Comments Dorothy Mann reports  stress from trying to decide what her next steps are medically: transplant or LVAD. She relies on her husband and a couple of sister who live close to support her. She is still working and would like to work around her schedule to attend cardiac rehab; she would like to increase her strength and balance as well.  She reports no sleep issues at this time. She  likes to shop, cooking, and going to the beach to help to relieve stress.      Family Dynamics   Good Support System? Yes   husband, sister     Barriers   Psychosocial barriers to participate in program The patient should benefit from training in stress management and relaxation.;Psychosocial barriers identified (see note)      Screening Interventions   Interventions Encouraged to exercise;Provide feedback about the scores to participant;To provide support and resources with identified psychosocial needs    Expected Outcomes Short Term goal: Utilizing psychosocial counselor, staff and physician to assist with identification of specific Stressors or current issues interfering with healing process. Setting desired goal for each stressor or current issue identified.;Long Term Goal: Stressors or current issues are controlled or eliminated.;Short Term goal: Identification and review with participant of any Quality of Life or Depression concerns found by scoring the questionnaire.;Long Term goal: The participant improves quality of Life and PHQ9 Scores as seen by post scores and/or verbalization of changes             Quality of Life Scores:   Quality of Life - 09/21/21 1344       Quality of Life   Select Quality of Life      Quality of Life Scores   Health/Function Pre 26.6 %    Socioeconomic Pre 28.75 %    Psych/Spiritual Pre 29.14 %    Family Pre 26.8 %    GLOBAL Pre 27.63 %            Scores of 19 and below usually indicate a poorer quality of life in these areas.  A difference of  2-3 points is a clinically meaningful  difference.  A difference of 2-3 points in the total score of the Quality of Life Index has been associated with significant improvement in overall quality of life, self-image, physical symptoms, and general health in studies assessing change in quality of life.  PHQ-9: Review Flowsheet       09/21/2021 12/07/2017  Depression screen PHQ 2/9  Decreased Interest 0 0  Down, Depressed, Hopeless 0 0  PHQ - 2 Score 0 0  Altered sleeping 0 0  Tired, decreased energy 0 0  Change in appetite 0 0  Feeling bad or failure about yourself  0 0  Trouble concentrating 0 0  Moving slowly or fidgety/restless 0 0  Suicidal thoughts 0 0  PHQ-9 Score 0 0  Difficult doing work/chores Not difficult at all -   Interpretation of Total Score  Total Score Depression Severity:  1-4 = Minimal depression, 5-9 = Mild depression, 10-14 = Moderate depression, 15-19 = Moderately severe depression, 20-27 = Severe depression   Psychosocial Evaluation and Intervention:  Psychosocial Evaluation - 09/15/21 1502       Psychosocial Evaluation & Interventions   Interventions Stress management education;Relaxation education;Encouraged to exercise with the program and follow exercise prescription    Comments Tachina reports stress from trying to decide what her next steps are medically: transplant or LVAD. She relies on her husband and a couple of sister who live close to support her. She is still working and would like to work around her schedule to attend cardiac rehab; she would like to increase her strength and balance as well.  She reports no sleep issues at this time. She likes to shop, cooking, and going to the beach to help to relieve stress.    Expected Outcomes ST: attend all scheduled cardiac rehab exercise/education sessions,  progess with exercise prescription LT: improve strength and progress with exercise prescription independently    Continue Psychosocial Services  Follow up required by staff              Psychosocial Re-Evaluation:   Psychosocial Discharge (Final Psychosocial Re-Evaluation):   Vocational Rehabilitation: Provide vocational rehab assistance to qualifying candidates.   Vocational Rehab Evaluation & Intervention:  Vocational Rehab - 09/15/21 1455       Initial Vocational Rehab Evaluation & Intervention   Assessment shows need for Vocational Rehabilitation No      Vocational Rehab Re-Evaulation   Comments She is still working.             Education: Education Goals: Education classes will be provided on a variety of topics geared toward better understanding of heart health and risk factor modification. Participant will state understanding/return demonstration of topics presented as noted by education test scores.  Learning Barriers/Preferences:  Learning Barriers/Preferences - 09/15/21 1451       Learning Barriers/Preferences   Learning Barriers None    Learning Preferences None             General Cardiac Education Topics:  AED/CPR: - Group verbal and written instruction with the use of models to demonstrate the basic use of the AED with the basic ABC's of resuscitation.   Anatomy and Cardiac Procedures: - Group verbal and visual presentation and models provide information about basic cardiac anatomy and function. Reviews the testing methods done to diagnose heart disease and the outcomes of the test results. Describes the treatment choices: Medical Management, Angioplasty, or Coronary Bypass Surgery for treating various heart conditions including Myocardial Infarction, Angina, Valve Disease, and Cardiac Arrhythmias.  Written material given at graduation.   Medication Safety: - Group verbal and visual instruction to review commonly prescribed medications for heart and lung disease. Reviews the medication, class of the drug, and side effects. Includes the steps to properly store meds and maintain the prescription regimen.  Written material given  at graduation.   Intimacy: - Group verbal instruction through game format to discuss how heart and lung disease can affect sexual intimacy. Written material given at graduation..   Know Your Numbers and Heart Failure: - Group verbal and visual instruction to discuss disease risk factors for cardiac and pulmonary disease and treatment options.  Reviews associated critical values for Overweight/Obesity, Hypertension, Cholesterol, and Diabetes.  Discusses basics of heart failure: signs/symptoms and treatments.  Introduces Heart Failure Zone chart for action plan for heart failure.  Written material given at graduation.   Infection Prevention: - Provides verbal and written material to individual with discussion of infection control including proper hand washing and proper equipment cleaning during exercise session. Flowsheet Row Cardiac Rehab from 09/21/2021 in [facility] Cardiac and Pulmonary Rehab  Education need identified 09/21/21  Date 09/21/21  Educator NT  Instruction Review Code 1- Verbalizes Understanding       Falls Prevention: - Provides verbal and written material to individual with discussion of falls prevention and safety. Flowsheet Row Cardiac Rehab from 09/21/2021 in [facility] Cardiac and Pulmonary Rehab  Education need identified 09/15/21  Date 09/15/21  Educator [place]  Instruction Review Code 1- Verbalizes Understanding       Other: -Provides group and verbal instruction on various topics (see comments)   Knowledge Questionnaire Score:  Knowledge Questionnaire Score - 09/21/21 1340       Knowledge Questionnaire Score   Pre Score 23/26  Core Components/Risk Factors/Patient Goals at Admission:  Personal Goals and Risk Factors at Admission - 09/21/21 1351       Core Components/Risk Factors/Patient Goals on Admission    Weight Management Weight Gain;Yes    Intervention Weight Management: Develop a combined nutrition and exercise program designed to reach  desired caloric intake, while maintaining appropriate intake of nutrient and fiber, sodium and fats, and appropriate energy expenditure required for the weight goal.;Weight Management: Provide education and appropriate resources to help participant work on and attain dietary goals.    Admit Weight 101 lb 11.2 oz (46.1 kg)    Goal Weight: Short Term 105 lb (47.6 kg)    Goal Weight: Long Term 110 lb (49.9 kg)    Expected Outcomes Short Term: Continue to assess and modify interventions until short term weight is achieved;Long Term: Adherence to nutrition and physical activity/exercise program aimed toward attainment of established weight goal;Weight Gain: Understanding of general recommendations for a high calorie, high protein meal plan that promotes weight gain by distributing calorie intake throughout the day with the consumption for 4-5 meals, snacks, and/or supplements;Understanding of distribution of calorie intake throughout the day with the consumption of 4-5 meals/snacks    Heart Failure Yes    Intervention Provide a combined exercise and nutrition program that is supplemented with education, support and counseling about heart failure. Directed toward relieving symptoms such as shortness of breath, decreased exercise tolerance, and extremity edema.    Expected Outcomes Short term: Attendance in program 2-3 days a week with increased exercise capacity. Reported lower sodium intake. Reported increased fruit and vegetable intake. Reports medication compliance.;Improve functional capacity of life;Short term: Daily weights obtained and reported for increase. Utilizing diuretic protocols set by physician.;Long term: Adoption of self-care skills and reduction of barriers for early signs and symptoms recognition and intervention leading to self-care maintenance.    Hypertension Yes    Intervention Provide education on lifestyle modifcations including regular physical activity/exercise, weight management,  moderate sodium restriction and increased consumption of fresh fruit, vegetables, and low fat dairy, alcohol moderation, and smoking cessation.;Monitor prescription use compliance.    Expected Outcomes Short Term: Continued assessment and intervention until BP is < 140/3m HG in hypertensive participants. < 130/82mHG in hypertensive participants with diabetes, heart failure or chronic kidney disease.;Long Term: Maintenance of blood pressure at goal levels.             Education:Diabetes - Individual verbal and written instruction to review signs/symptoms of diabetes, desired ranges of glucose level fasting, after meals and with exercise. Acknowledge that pre and post exercise glucose checks will be done for 3 sessions at entry of program.   Core Components/Risk Factors/Patient Goals Review:    Core Components/Risk Factors/Patient Goals at Discharge (Final Review):    ITP Comments:  ITP Comments     Row Name 05/12/21 1046 09/15/21 1511 09/21/21 1035       ITP Comments Initial telephone orientation completed. [facility] EP orientation scheduled for Tuesday 4/11 at 8am. Virtual orientation call completed today. She has an appointment on Date: 09/21/21  for EP eval and gym Orientation.  [facility] . Completed 6MWT and gym orientation. Initial ITP created and sent for review to Dr. MaEmily FilbertMedical Director.              Comments: Initial ITP

## 2021-09-23 DIAGNOSIS — I5022 Chronic systolic (congestive) heart failure: Secondary | ICD-10-CM | POA: Diagnosis not present

## 2021-09-23 NOTE — Progress Notes (Signed)
Daily Session Note  Patient Details  Name: Dorothy Mann MRN: [id number] Date of Birth: 04-17-1955 Referring Provider:   Flowsheet Row Cardiac Rehab from 09/21/2021 in [facility] Cardiac and Pulmonary Rehab  Referring Provider Byku       Encounter Date: 09/23/2021  Check In:  Session Check In - 09/23/21 1735       Check-In   Supervising physician immediately available to respond to emergencies See telemetry face sheet for immediately available MD    Location [facility]-Cardiac & Pulmonary Rehab    Staff Present Renita Papa, RN BSN;Agness Sibrian Desma Maxim, RN, Margurite Auerbach, MS, ASCM CEP, Exercise Physiologist    Virtual Visit No    Medication changes reported     No    Fall or balance concerns reported    No    Warm-up and Cool-down Performed on first and last piece of equipment    Resistance Training Performed Yes    VAD Patient? No    PAD/SET Patient? No      Pain Assessment   Currently in Pain? No/denies                Social History   Tobacco Use  Smoking Status Never  Smokeless Tobacco Never    Goals Met:  Independence with exercise equipment Exercise tolerated well No report of concerns or symptoms today Strength training completed today  Goals Unmet:  Not Applicable  Comments: First full day of exercise!  Patient was oriented to gym and equipment including functions, settings, policies, and procedures.  Patient's individual exercise prescription and treatment plan were reviewed.  All starting workloads were established based on the results of the 6 minute walk test done at initial orientation visit.  The plan for exercise progression was also introduced and progression will be customized based on patient's performance and goals.    Dr. Emily Filbert is Medical Director for [place].  Dr. Ottie Glazier is Medical Director for [facility] Pulmonary Rehabilitation.

## 2021-09-27 ENCOUNTER — Encounter: Payer: BC Managed Care – PPO | Admitting: *Deleted

## 2021-09-27 DIAGNOSIS — I5022 Chronic systolic (congestive) heart failure: Secondary | ICD-10-CM | POA: Diagnosis not present

## 2021-09-27 NOTE — Progress Notes (Signed)
Daily Session Note  Patient Details  Name: Dorothy Mann MRN: [id number] Date of Birth: September 02, 1955 Referring Provider:   Flowsheet Row Cardiac Rehab from 09/21/2021 in [facility] Cardiac and Pulmonary Rehab  Referring Provider Byku       Encounter Date: 09/27/2021  Check In:  Session Check In - 09/27/21 1738       Check-In   Supervising physician immediately available to respond to emergencies See telemetry face sheet for immediately available ER MD    Location [facility]-Cardiac & Pulmonary Rehab    Staff Present Hope Budds, RDN, LDN;Joseph Tessie Fass, Cristopher Estimable, RN BSN    Virtual Visit No    Medication changes reported     No    Fall or balance concerns reported    No    Warm-up and Cool-down Performed on first and last piece of equipment    Resistance Training Performed Yes    VAD Patient? No    PAD/SET Patient? No      Pain Assessment   Currently in Pain? No/denies                Social History   Tobacco Use  Smoking Status Never  Smokeless Tobacco Never    Goals Met:  Independence with exercise equipment Exercise tolerated well No report of concerns or symptoms today Strength training completed today  Goals Unmet:  Not Applicable  Comments: Pt able to follow exercise prescription today without complaint.  Will continue to monitor for progression.    Dr. Emily Filbert is Medical Director for [place].  Dr. Ottie Glazier is Medical Director for [facility] Pulmonary Rehabilitation.

## 2021-09-30 ENCOUNTER — Encounter: Payer: BC Managed Care – PPO | Admitting: *Deleted

## 2021-09-30 DIAGNOSIS — I5022 Chronic systolic (congestive) heart failure: Secondary | ICD-10-CM

## 2021-09-30 NOTE — Progress Notes (Signed)
Daily Session Note  Patient Details  Name: Dorothy Mann MRN: [id number] Date of Birth: 1955-07-28 Referring Provider:   Flowsheet Row Cardiac Rehab from 09/21/2021 in [facility] Cardiac and Pulmonary Rehab  Referring Provider Byku       Encounter Date: 09/30/2021  Check In:  Session Check In - 09/30/21 1736       Check-In   Supervising physician immediately available to respond to emergencies See telemetry face sheet for immediately available ER MD    Location [facility]-Cardiac & Pulmonary Rehab    Staff Present Renita Papa, RN BSN;Joseph [place], RCP,RRT,BSRT;Laureen Wynne, [place], RRT, CPFT    Virtual Visit No    Medication changes reported     No    Fall or balance concerns reported    No    Warm-up and Cool-down Performed on first and last piece of equipment    Resistance Training Performed Yes    VAD Patient? No    PAD/SET Patient? No      Pain Assessment   Currently in Pain? No/denies                Social History   Tobacco Use  Smoking Status Never  Smokeless Tobacco Never    Goals Met:  Independence with exercise equipment Exercise tolerated well No report of concerns or symptoms today Strength training completed today  Goals Unmet:  Not Applicable  Comments: Pt able to follow exercise prescription today without complaint.  Will continue to monitor for progression.    Dr. Emily Filbert is Medical Director for [place].  Dr. Ottie Glazier is Medical Director for [facility] Pulmonary Rehabilitation.

## 2021-10-04 ENCOUNTER — Encounter: Payer: BC Managed Care – PPO | Admitting: *Deleted

## 2021-10-04 DIAGNOSIS — I5022 Chronic systolic (congestive) heart failure: Secondary | ICD-10-CM

## 2021-10-04 NOTE — Progress Notes (Signed)
Daily Session Note  Patient Details  Name: Dorothy Mann MRN: [id number] Date of Birth: May 03, 1955 Referring Provider:   Flowsheet Row Cardiac Rehab from 09/21/2021 in [facility] Cardiac and Pulmonary Rehab  Referring Provider Byku       Encounter Date: 10/04/2021  Check In:  Session Check In - 10/04/21 1750       Check-In   Supervising physician immediately available to respond to emergencies See telemetry face sheet for immediately available ER MD    Location [facility]-Cardiac & Pulmonary Rehab    Staff Present Renita Papa, RN BSN;Joseph Tessie Fass, RCP,RRT,BSRT;Melissa [place], [place], LDN    Virtual Visit No    Medication changes reported     No    Fall or balance concerns reported    No    Warm-up and Cool-down Performed on first and last piece of equipment    Resistance Training Performed Yes    VAD Patient? No    PAD/SET Patient? No      Pain Assessment   Currently in Pain? No/denies                Social History   Tobacco Use  Smoking Status Never  Smokeless Tobacco Never    Goals Met:  Independence with exercise equipment Exercise tolerated well No report of concerns or symptoms today Strength training completed today  Goals Unmet:  Not Applicable  Comments: Pt able to follow exercise prescription today without complaint.  Will continue to monitor for progression.     Dr. Emily Filbert is Medical Director for [place].  Dr. Ottie Glazier is Medical Director for [facility] Pulmonary Rehabilitation.

## 2021-10-06 ENCOUNTER — Encounter: Payer: BC Managed Care – PPO | Admitting: *Deleted

## 2021-10-06 DIAGNOSIS — I5022 Chronic systolic (congestive) heart failure: Secondary | ICD-10-CM | POA: Diagnosis not present

## 2021-10-06 NOTE — Progress Notes (Signed)
Daily Session Note  Patient Details  Name: Dorothy Mann MRN: [id number] Date of Birth: 07/12/1955 Referring Provider:   Flowsheet Row Cardiac Rehab from 09/21/2021 in [facility] Cardiac and Pulmonary Rehab  Referring Provider Byku       Encounter Date: 10/06/2021  Check In:  Session Check In - 10/06/21 1716       Check-In   Supervising physician immediately available to respond to emergencies See telemetry face sheet for immediately available ER MD    Location [facility]-Cardiac & Pulmonary Rehab    Staff Present Renita Papa, RN BSN;Joseph Tessie Fass, RCP,RRT,BSRT;Melissa [place], [place], LDN    Virtual Visit No    Medication changes reported     No    Fall or balance concerns reported    No    Warm-up and Cool-down Performed on first and last piece of equipment    Resistance Training Performed Yes    VAD Patient? No    PAD/SET Patient? No      Pain Assessment   Currently in Pain? No/denies                Social History   Tobacco Use  Smoking Status Never  Smokeless Tobacco Never    Goals Met:  Independence with exercise equipment Exercise tolerated well No report of concerns or symptoms today Strength training completed today  Goals Unmet:  Not Applicable  Comments: Pt able to follow exercise prescription today without complaint.  Will continue to monitor for progression.    Dr. Emily Filbert is Medical Director for [place].  Dr. Ottie Glazier is Medical Director for [facility] Pulmonary Rehabilitation.

## 2021-10-07 ENCOUNTER — Encounter: Payer: BC Managed Care – PPO | Admitting: *Deleted

## 2021-10-07 DIAGNOSIS — I5022 Chronic systolic (congestive) heart failure: Secondary | ICD-10-CM

## 2021-10-07 NOTE — Progress Notes (Signed)
Daily Session Note  Patient Details  Name: Surina Storts MRN: [id number] Date of Birth: 1955/10/31 Referring Provider:   Flowsheet Row Cardiac Rehab from 09/21/2021 in [facility] Cardiac and Pulmonary Rehab  Referring Provider Byku       Encounter Date: 10/07/2021  Check In:  Session Check In - 10/07/21 1736       Check-In   Supervising physician immediately available to respond to emergencies See telemetry face sheet for immediately available ER MD    Location [facility]-Cardiac & Pulmonary Rehab    Staff Present Renita Papa, RN BSN;Joseph [place], RCP,RRT,BSRT;Kara [place], [place], ASCM CEP, Exercise Physiologist    Virtual Visit No    Medication changes reported     No    Fall or balance concerns reported    No    Warm-up and Cool-down Performed on first and last piece of equipment    Resistance Training Performed Yes    VAD Patient? No    PAD/SET Patient? No      Pain Assessment   Currently in Pain? No/denies                Social History   Tobacco Use  Smoking Status Never  Smokeless Tobacco Never    Goals Met:  Independence with exercise equipment Exercise tolerated well No report of concerns or symptoms today Strength training completed today  Goals Unmet:  Not Applicable  Comments: Pt able to follow exercise prescription today without complaint.  Will continue to monitor for progression.    Dr. Emily Filbert is Medical Director for [place].  Dr. Ottie Glazier is Medical Director for [facility] Pulmonary Rehabilitation.

## 2021-10-13 ENCOUNTER — Encounter: Payer: Self-pay | Admitting: *Deleted

## 2021-10-13 DIAGNOSIS — I5022 Chronic systolic (congestive) heart failure: Secondary | ICD-10-CM

## 2021-10-13 NOTE — Progress Notes (Signed)
Cardiac Individual Treatment Plan  Patient Details  Name: Dorothy Mann MRN: [id number] Date of Birth: [birth year] Referring Provider:   Flowsheet Row Cardiac Rehab from 09/21/2021 in [facility] Cardiac and Pulmonary Rehab  Referring Provider Byku       Initial Encounter Date:  Flowsheet Row Cardiac Rehab from 09/21/2021 in [facility] Cardiac and Pulmonary Rehab  Date 09/21/21       Visit Diagnosis: Chronic systolic HF (heart failure) ([place])  Patient's Home Medications on Admission:  Current Outpatient Medications:    alendronate (FOSAMAX) 70 MG tablet, Take by mouth., Disp: , Rfl:    allopurinol (ZYLOPRIM) 100 MG tablet, Take 100 mg by mouth daily., Disp: , Rfl:    Cholecalciferol 25 MCG (1000 UT) tablet, Take by mouth., Disp: , Rfl:    colchicine 0.6 MG tablet, Take 0.6 mg by mouth daily., Disp: , Rfl:    ferrous sulfate 325 (65 FE) MG tablet, Take by mouth. (Patient not taking: Reported on 09/15/2021), Disp: , Rfl:    folic acid (FOLVITE) 539 MCG tablet, Take by mouth., Disp: , Rfl:    furosemide (LASIX) 20 MG tablet, 40 mg., Disp: , Rfl:    gabapentin (NEURONTIN) 100 MG capsule, Take by mouth., Disp: , Rfl:    gabapentin (NEURONTIN) 100 MG capsule, as needed., Disp: , Rfl:    HYDROcodone-acetaminophen (NORCO/VICODIN) 5-325 MG tablet, Take 1-2 tablets by mouth every 6 (six) hours as needed. (Patient not taking: Reported on 09/15/2021), Disp: 21 tablet, Rfl: 0   JARDIANCE 10 MG TABS tablet, Take 10 mg by mouth daily., Disp: , Rfl:    loperamide (IMODIUM) 2 MG capsule, Take by mouth as needed., Disp: , Rfl:    magnesium oxide (MAG-OX) 400 MG tablet, Take 800 mg by mouth., Disp: , Rfl:    metoprolol succinate (TOPROL-XL) 50 MG 24 hr tablet, Take by mouth., Disp: , Rfl:    potassium chloride SA (KLOR-CON M) 20 MEQ tablet, Take 40 mEq by mouth daily. (Patient not taking: Reported on 09/15/2021), Disp: , Rfl:    sacubitril-valsartan (ENTRESTO) 24-26 MG, Take 1 tablet by mouth 2 (two) times daily., Disp:  , Rfl:    spironolactone (ALDACTONE) 25 MG tablet, Take by mouth., Disp: , Rfl:    spironolactone (ALDACTONE) 25 MG tablet, Take 0.5 tablets by mouth daily., Disp: , Rfl:    vitamin B-12 (CYANOCOBALAMIN) 100 MCG tablet, Take by mouth., Disp: , Rfl:   Past Medical History: Past Medical History:  Diagnosis Date   Breast cancer ([place]) 1997   left breast   Cancer ([place])    breast cancer   Hypertension    Personal history of chemotherapy    Personal history of radiation therapy     Tobacco Use: Social History   Tobacco Use  Smoking Status Never  Smokeless Tobacco Never    Labs: Review Flowsheet        No data to display           Exercise Target Goals: Exercise Program Goal: Individual exercise prescription set using results from initial 6 min walk test and THRR while considering  patient's activity barriers and safety.   Exercise Prescription Goal: Initial exercise prescription builds to 30-45 minutes a day of aerobic activity, 2-3 days per week.  Home exercise guidelines will be given to patient during program as part of exercise prescription that the participant will acknowledge.   Education: Aerobic Exercise: - Group verbal and visual presentation on the components of exercise prescription. Introduces F.I.T.T principle from ACSM  for exercise prescriptions.  Reviews F.I.T.T. principles of aerobic exercise including progression. Written material given at graduation. Flowsheet Row Cardiac Rehab from 09/21/2021 in [facility] Cardiac and Pulmonary Rehab  Education need identified 09/21/21       Education: Resistance Exercise: - Group verbal and visual presentation on the components of exercise prescription. Introduces F.I.T.T principle from ACSM for exercise prescriptions  Reviews F.I.T.T. principles of resistance exercise including progression. Written material given at graduation.    Education: Exercise & Equipment Safety: - Individual verbal instruction and demonstration  of equipment use and safety with use of the equipment. Flowsheet Row Cardiac Rehab from 09/21/2021 in [facility] Cardiac and Pulmonary Rehab  Education need identified 09/21/21  Date 09/21/21  Educator NT  Instruction Review Code 1- Verbalizes Understanding       Education: Exercise Physiology & General Exercise Guidelines: - Group verbal and written instruction with models to review the exercise physiology of the cardiovascular system and associated critical values. Provides general exercise guidelines with specific guidelines to those with heart or lung disease.  Flowsheet Row Cardiac Rehab from 09/21/2021 in [facility] Cardiac [facility] Rehab  Education need identified 09/21/21       Education: Flexibility, Balance, Mind/Body Relaxation: - Group verbal and visual presentation with interactive activity on the components of exercise prescription. Introduces F.I.T.T principle from ACSM for exercise prescriptions. Reviews F.I.T.T. principles of flexibility and balance exercise training including progression. Also discusses the mind body connection.  Reviews various relaxation techniques to help reduce and manage stress (i.e. Deep breathing, progressive muscle relaxation, and visualization). Balance handout provided to take home. Written material given at graduation.   Activity Barriers & Risk Stratification:  Activity Barriers & Cardiac Risk Stratification - 09/21/21 1039       Activity Barriers & Cardiac Risk Stratification   Activity Barriers Joint Problems;Balance Concerns;Muscular Weakness;Deconditioning    Cardiac Risk Stratification High             6 Minute Walk:  6 Minute Walk     Row Name 09/21/21 1036         6 Minute Walk   Phase Initial     Distance 700 feet     Walk Time 5.83 minutes     # of Rest Breaks 1     MPH 1.36     METS 2.39     RPE 11     Perceived Dyspnea  0     VO2 Peak 8.36     Symptoms No     Resting HR 76 bpm     Resting BP 84/58     Resting  Oxygen Saturation  100 %     Exercise Oxygen Saturation  during 6 min walk 100 %     Max Ex. HR 90 bpm     Max Ex. BP 96/60     2 Minute Post BP 82/54              Oxygen Initial Assessment:   Oxygen Re-Evaluation:   Oxygen Discharge (Final Oxygen Re-Evaluation):   Initial Exercise Prescription:  Initial Exercise Prescription - 09/21/21 1100       Date of Initial Exercise RX and Referring Provider   Date 09/21/21    Referring Provider Byku      Oxygen   Maintain Oxygen Saturation 88% or higher      Treadmill   MPH 1.3    Grade 0    Minutes 15    METs 2  NuStep   Level 1    SPM 80    Minutes 15    METs 2.39      REL-XR   Level 1    Minutes 15    METs 2.39      Biostep-RELP   Level 1    SPM 60    Minutes 15    METs 2.39      Track   Laps 20    Minutes 15    METs 2.09      Prescription Details   Frequency (times per week) 3    Duration Progress to 30 minutes of continuous aerobic without signs/symptoms of physical distress      Intensity   THRR 40-80% of Max Heartrate 108-140    Ratings of Perceived Exertion 11-15    Perceived Dyspnea 0-4      Progression   Progression Continue to progress workloads to maintain intensity without signs/symptoms of physical distress.      Resistance Training   Training Prescription Yes    Weight 2 lb    Reps 10-15             Perform Capillary Blood Glucose checks as needed.  Exercise Prescription Changes:   Exercise Prescription Changes     Row Name 09/21/21 1100 10/04/21 1400           Response to Exercise   Blood Pressure (Admit) 84/58 108/64      Blood Pressure (Exercise) 96/60 102/56      Blood Pressure (Exit) 82/54 100/60      Heart Rate (Admit) 76 bpm 76 bpm      Heart Rate (Exercise) 90 bpm 93 bpm      Heart Rate (Exit) 74 bpm 81 bpm      Oxygen Saturation (Admit) 100 % --      Oxygen Saturation (Exercise) 100 % --      Oxygen Saturation (Exit) 100 % --      Rating of  Perceived Exertion (Exercise) 11 13      Perceived Dyspnea (Exercise) 0 --      Symptoms none none      Comments 6MWT results third full day of exercise      Duration Progress to 30 minutes of  aerobic without signs/symptoms of physical distress Progress to 30 minutes of  aerobic without signs/symptoms of physical distress      Intensity THRR unchanged THRR unchanged        Progression   Progression -- Continue to progress workloads to maintain intensity without signs/symptoms of physical distress.      Average METs -- 1.6        Resistance Training   Training Prescription -- Yes      Weight -- 2 lb      Reps -- 10-15        Interval Training   Interval Training -- No        NuStep   Level -- 1      Minutes -- 15      METs -- 2        REL-XR   Level -- 1      Minutes -- 15      METs -- 1.1        Track   Laps -- 20      Minutes -- 15      METs -- 1.8        Oxygen   Maintain Oxygen Saturation -- 88%  or higher               Exercise Comments:   Exercise Goals and Review:   Exercise Goals     Row Name 09/21/21 1336             Exercise Goals   Increase Physical Activity Yes       Intervention Provide advice, education, support and counseling about physical activity/exercise needs.;Develop an individualized exercise prescription for aerobic and resistive training based on initial evaluation findings, risk stratification, comorbidities and participant's personal goals.       Expected Outcomes Short Term: Attend rehab on a regular basis to increase amount of physical activity.;Long Term: Add in home exercise to make exercise part of routine and to increase amount of physical activity.;Long Term: Exercising regularly at least 3-5 days a week.       Increase Strength and Stamina Yes       Intervention Provide advice, education, support and counseling about physical activity/exercise needs.;Develop an individualized exercise prescription for aerobic and resistive  training based on initial evaluation findings, risk stratification, comorbidities and participant's personal goals.       Expected Outcomes Short Term: Increase workloads from initial exercise prescription for resistance, speed, and METs.;Short Term: Perform resistance training exercises routinely during rehab and add in resistance training at home;Long Term: Improve cardiorespiratory fitness, muscular endurance and strength as measured by increased METs and functional capacity (6MWT)       Able to understand and use rate of perceived exertion (RPE) scale Yes       Intervention Provide education and explanation on how to use RPE scale       Expected Outcomes Short Term: Able to use RPE daily in rehab to express subjective intensity level;Long Term:  Able to use RPE to guide intensity level when exercising independently       Able to understand and use Dyspnea scale Yes       Intervention Provide education and explanation on how to use Dyspnea scale       Expected Outcomes Short Term: Able to use Dyspnea scale daily in rehab to express subjective sense of shortness of breath during exertion;Long Term: Able to use Dyspnea scale to guide intensity level when exercising independently       Knowledge and understanding of Target Heart Rate Range (THRR) Yes       Intervention Provide education and explanation of THRR including how the numbers were predicted and where they are located for reference       Expected Outcomes Short Term: Able to state/look up THRR;Short Term: Able to use daily as guideline for intensity in rehab;Long Term: Able to use THRR to govern intensity when exercising independently       Able to check pulse independently Yes       Intervention Review the importance of being able to check your own pulse for safety during independent exercise;Provide education and demonstration on how to check pulse in carotid and radial arteries.       Expected Outcomes Short Term: Able to explain why pulse  checking is important during independent exercise;Long Term: Able to check pulse independently and accurately       Understanding of Exercise Prescription Yes       Intervention Provide education, explanation, and written materials on patient's individual exercise prescription       Expected Outcomes Long Term: Able to explain home exercise prescription to exercise independently;Short Term: Able to explain program exercise prescription  Exercise Goals Re-Evaluation :  Exercise Goals Re-Evaluation     Jerome Name 09/23/21 1739 10/04/21 1420           Exercise Goal Re-Evaluation   Exercise Goals Review Increase Physical Activity;Able to understand and use rate of perceived exertion (RPE) scale;Knowledge and understanding of Target Heart Rate Range (THRR);Understanding of Exercise Prescription;Increase Strength and Stamina;Able to check pulse independently Increase Physical Activity;Increase Strength and Stamina;Understanding of Exercise Prescription      Comments Reviewed RPE and dyspnea scales, THR and program prescription with pt today.  Pt voiced understanding and was given a copy of goals to take home. Zenaya is off to a good start in rehab.  She has completed her first three full days of exercise thus far.  She is already up to 2 METs on the NuStep. We will continue monitor her progress.      Expected Outcomes Short: Use RPE daily to regulate intensity. Long: Follow program prescription in THR. Short: Conitnue to attend rehab regularly Long: Continue to follow program prescription               Discharge Exercise Prescription (Final Exercise Prescription Changes):  Exercise Prescription Changes - 10/04/21 1400       Response to Exercise   Blood Pressure (Admit) 108/64    Blood Pressure (Exercise) 102/56    Blood Pressure (Exit) 100/60    Heart Rate (Admit) 76 bpm    Heart Rate (Exercise) 93 bpm    Heart Rate (Exit) 81 bpm    Rating of Perceived Exertion  (Exercise) 13    Symptoms none    Comments third full day of exercise    Duration Progress to 30 minutes of  aerobic without signs/symptoms of physical distress    Intensity THRR unchanged      Progression   Progression Continue to progress workloads to maintain intensity without signs/symptoms of physical distress.    Average METs 1.6      Resistance Training   Training Prescription Yes    Weight 2 lb    Reps 10-15      Interval Training   Interval Training No      NuStep   Level 1    Minutes 15    METs 2      REL-XR   Level 1    Minutes 15    METs 1.1      Track   Laps 20    Minutes 15    METs 1.8      Oxygen   Maintain Oxygen Saturation 88% or higher             Nutrition:  Target Goals: Understanding of nutrition guidelines, daily intake of sodium '1500mg'$ , cholesterol '200mg'$ , calories 30% from fat and 7% or less from saturated fats, daily to have 5 or more servings of fruits and vegetables.  Education: All About Nutrition: -Group instruction provided by verbal, written material, interactive activities, discussions, models, and posters to present general guidelines for heart healthy nutrition including fat, fiber, MyPlate, the role of sodium in heart healthy nutrition, utilization of the nutrition label, and utilization of this knowledge for meal planning. Follow up email sent as well. Written material given at graduation.   Biometrics:  Pre Biometrics - 09/21/21 1038       Pre Biometrics   Height '5\' 5"'$  (1.651 m)    Weight 101 lb 11.2 oz (46.1 kg)    BMI (Calculated) 16.92    Single Leg Stand 3.8  seconds              Nutrition Therapy Plan and Nutrition Goals:  Nutrition Therapy & Goals - 09/21/21 1337       Intervention Plan   Intervention Prescribe, educate and counsel regarding individualized specific dietary modifications aiming towards targeted core components such as weight, hypertension, lipid management, diabetes, heart failure and  other comorbidities.    Expected Outcomes Short Term Goal: Understand basic principles of dietary content, such as calories, fat, sodium, cholesterol and nutrients.;Long Term Goal: Adherence to prescribed nutrition plan.;Short Term Goal: A plan has been developed with personal nutrition goals set during dietitian appointment.             Nutrition Assessments:  MEDIFICTS Score Key: ?70 Need to make dietary changes  40-70 Heart Healthy Diet ? 40 Therapeutic Level Cholesterol Diet  Flowsheet Row Cardiac Rehab from 09/21/2021 in [facility] Cardiac and Pulmonary Rehab  Picture Your Plate Total Score on Admission 52      Picture Your Plate Scores: <42 Unhealthy dietary pattern with much room for improvement. 41-50 Dietary pattern unlikely to meet recommendations for good health and room for improvement. 51-60 More healthful dietary pattern, with some room for improvement.  >60 Healthy dietary pattern, although there may be some specific behaviors that could be improved.    Nutrition Goals Re-Evaluation:   Nutrition Goals Discharge (Final Nutrition Goals Re-Evaluation):   Psychosocial: Target Goals: Acknowledge presence or absence of significant depression and/or stress, maximize coping skills, provide positive support system. Participant is able to verbalize types and ability to use techniques and skills needed for reducing stress and depression.   Education: Stress, Anxiety, and Depression - Group verbal and visual presentation to define topics covered.  Reviews how body is impacted by stress, anxiety, and depression.  Also discusses healthy ways to reduce stress and to treat/manage anxiety and depression.  Written material given at graduation.   Education: Sleep Hygiene -Provides group verbal and written instruction about how sleep can affect your health.  Define sleep hygiene, discuss sleep cycles and impact of sleep habits. Review good sleep hygiene tips.    Initial Review &  Psychosocial Screening:  Initial Psych Review & Screening - 09/15/21 1456       Initial Review   Current issues with Current Stress Concerns    Source of Stress Concerns Chronic Illness    Comments Rudine reports stress from trying to decide what her next steps are medically: transplant or LVAD. She relies on her husband and a couple of sister who live close to support her. She is still working and would like to work around her schedule to attend cardiac rehab; she would like to increase her strength and balance as well.  She reports no sleep issues at this time. She likes to shop, cooking, and going to the beach to help to relieve stress.      Family Dynamics   Good Support System? Yes   husband, sister     Barriers   Psychosocial barriers to participate in program The patient should benefit from training in stress management and relaxation.;Psychosocial barriers identified (see note)      Screening Interventions   Interventions Encouraged to exercise;Provide feedback about the scores to participant;To provide support and resources with identified psychosocial needs    Expected Outcomes Short Term goal: Utilizing psychosocial counselor, staff and physician to assist with identification of specific Stressors or current issues interfering with healing process. Setting desired goal for each stressor or  current issue identified.;Long Term Goal: Stressors or current issues are controlled or eliminated.;Short Term goal: Identification and review with participant of any Quality of Life or Depression concerns found by scoring the questionnaire.;Long Term goal: The participant improves quality of Life and PHQ9 Scores as seen by post scores and/or verbalization of changes             Quality of Life Scores:   Quality of Life - 09/21/21 1344       Quality of Life   Select Quality of Life      Quality of Life Scores   Health/Function Pre 26.6 %    Socioeconomic Pre 28.75 %    Psych/Spiritual  Pre 29.14 %    Family Pre 26.8 %    GLOBAL Pre 27.63 %            Scores of 19 and below usually indicate a poorer quality of life in these areas.  A difference of  2-3 points is a clinically meaningful difference.  A difference of 2-3 points in the total score of the Quality of Life Index has been associated with significant improvement in overall quality of life, self-image, physical symptoms, and general health in studies assessing change in quality of life.  PHQ-9: Review Flowsheet       09/21/2021 12/07/2017  Depression screen PHQ 2/9  Decreased Interest 0 0  Down, Depressed, Hopeless 0 0  PHQ - 2 Score 0 0  Altered sleeping 0 0  Tired, decreased energy 0 0  Change in appetite 0 0  Feeling bad or failure about yourself  0 0  Trouble concentrating 0 0  Moving slowly or fidgety/restless 0 0  Suicidal thoughts 0 0  PHQ-9 Score 0 0  Difficult doing work/chores Not difficult at all -   Interpretation of Total Score  Total Score Depression Severity:  1-4 = Minimal depression, 5-9 = Mild depression, 10-14 = Moderate depression, 15-19 = Moderately severe depression, 20-27 = Severe depression   Psychosocial Evaluation and Intervention:  Psychosocial Evaluation - 09/15/21 1502       Psychosocial Evaluation & Interventions   Interventions Stress management education;Relaxation education;Encouraged to exercise with the program and follow exercise prescription    Comments Arnold reports stress from trying to decide what her next steps are medically: transplant or LVAD. She relies on her husband and a couple of sister who live close to support her. She is still working and would like to work around her schedule to attend cardiac rehab; she would like to increase her strength and balance as well.  She reports no sleep issues at this time. She likes to shop, cooking, and going to the beach to help to relieve stress.    Expected Outcomes ST: attend all scheduled cardiac rehab  exercise/education sessions, progess with exercise prescription LT: improve strength and progress with exercise prescription independently    Continue Psychosocial Services  Follow up required by staff             Psychosocial Re-Evaluation:   Psychosocial Discharge (Final Psychosocial Re-Evaluation):   Vocational Rehabilitation: Provide vocational rehab assistance to qualifying candidates.   Vocational Rehab Evaluation & Intervention:  Vocational Rehab - 09/15/21 1455       Initial Vocational Rehab Evaluation & Intervention   Assessment shows need for Vocational Rehabilitation No      Vocational Rehab Re-Evaulation   Comments She is still working.             Education: Education  Goals: Education classes will be provided on a variety of topics geared toward better understanding of heart health and risk factor modification. Participant will state understanding/return demonstration of topics presented as noted by education test scores.  Learning Barriers/Preferences:  Learning Barriers/Preferences - 09/15/21 1451       Learning Barriers/Preferences   Learning Barriers None    Learning Preferences None             General Cardiac Education Topics:  AED/CPR: - Group verbal and written instruction with the use of models to demonstrate the basic use of the AED with the basic ABC's of resuscitation.   Anatomy and Cardiac Procedures: - Group verbal and visual presentation and models provide information about basic cardiac anatomy and function. Reviews the testing methods done to diagnose heart disease and the outcomes of the test results. Describes the treatment choices: Medical Management, Angioplasty, or Coronary Bypass Surgery for treating various heart conditions including Myocardial Infarction, Angina, Valve Disease, and Cardiac Arrhythmias.  Written material given at graduation.   Medication Safety: - Group verbal and visual instruction to review commonly  prescribed medications for heart and lung disease. Reviews the medication, class of the drug, and side effects. Includes the steps to properly store meds and maintain the prescription regimen.  Written material given at graduation.   Intimacy: - Group verbal instruction through game format to discuss how heart and lung disease can affect sexual intimacy. Written material given at graduation..   Know Your Numbers and Heart Failure: - Group verbal and visual instruction to discuss disease risk factors for cardiac and pulmonary disease and treatment options.  Reviews associated critical values for Overweight/Obesity, Hypertension, Cholesterol, and Diabetes.  Discusses basics of heart failure: signs/symptoms and treatments.  Introduces Heart Failure Zone chart for action plan for heart failure.  Written material given at graduation.   Infection Prevention: - Provides verbal and written material to individual with discussion of infection control including proper hand washing and proper equipment cleaning during exercise session. Flowsheet Row Cardiac Rehab from 09/21/2021 in [facility] Cardiac and Pulmonary Rehab  Education need identified 09/21/21  Date 09/21/21  Educator NT  Instruction Review Code 1- Verbalizes Understanding       Falls Prevention: - Provides verbal and written material to individual with discussion of falls prevention and safety. Flowsheet Row Cardiac Rehab from 09/21/2021 in [facility] Cardiac and Pulmonary Rehab  Education need identified 09/15/21  Date 09/15/21  Educator [place]  Instruction Review Code 1- Verbalizes Understanding       Other: -Provides group and verbal instruction on various topics (see comments)   Knowledge Questionnaire Score:  Knowledge Questionnaire Score - 09/21/21 1340       Knowledge Questionnaire Score   Pre Score 23/26             Core Components/Risk Factors/Patient Goals at Admission:  Personal Goals and Risk Factors at Admission -  09/21/21 1351       Core Components/Risk Factors/Patient Goals on Admission    Weight Management Weight Gain;Yes    Intervention Weight Management: Develop a combined nutrition and exercise program designed to reach desired caloric intake, while maintaining appropriate intake of nutrient and fiber, sodium and fats, and appropriate energy expenditure required for the weight goal.;Weight Management: Provide education and appropriate resources to help participant work on and attain dietary goals.    Admit Weight 101 lb 11.2 oz (46.1 kg)    Goal Weight: Short Term 105 lb (47.6 kg)    Goal Weight:  Long Term 110 lb (49.9 kg)    Expected Outcomes Short Term: Continue to assess and modify interventions until short term weight is achieved;Long Term: Adherence to nutrition and physical activity/exercise program aimed toward attainment of established weight goal;Weight Gain: Understanding of general recommendations for a high calorie, high protein meal plan that promotes weight gain by distributing calorie intake throughout the day with the consumption for 4-5 meals, snacks, and/or supplements;Understanding of distribution of calorie intake throughout the day with the consumption of 4-5 meals/snacks    Heart Failure Yes    Intervention Provide a combined exercise and nutrition program that is supplemented with education, support and counseling about heart failure. Directed toward relieving symptoms such as shortness of breath, decreased exercise tolerance, and extremity edema.    Expected Outcomes Short term: Attendance in program 2-3 days a week with increased exercise capacity. Reported lower sodium intake. Reported increased fruit and vegetable intake. Reports medication compliance.;Improve functional capacity of life;Short term: Daily weights obtained and reported for increase. Utilizing diuretic protocols set by physician.;Long term: Adoption of self-care skills and reduction of barriers for early signs and  symptoms recognition and intervention leading to self-care maintenance.    Hypertension Yes    Intervention Provide education on lifestyle modifcations including regular physical activity/exercise, weight management, moderate sodium restriction and increased consumption of fresh fruit, vegetables, and low fat dairy, alcohol moderation, and smoking cessation.;Monitor prescription use compliance.    Expected Outcomes Short Term: Continued assessment and intervention until BP is < 140/20m HG in hypertensive participants. < 130/830mHG in hypertensive participants with diabetes, heart failure or chronic kidney disease.;Long Term: Maintenance of blood pressure at goal levels.             Education:Diabetes - Individual verbal and written instruction to review signs/symptoms of diabetes, desired ranges of glucose level fasting, after meals and with exercise. Acknowledge that pre and post exercise glucose checks will be done for 3 sessions at entry of program.   Core Components/Risk Factors/Patient Goals Review:    Core Components/Risk Factors/Patient Goals at Discharge (Final Review):    ITP Comments:  ITP Comments     Row Name 09/15/21 1511 09/21/21 1035 09/23/21 1738 10/13/21 1343     ITP Comments Virtual orientation call completed today. She has an appointment on Date: 09/21/21  for EP eval and gym Orientation.  [facility] . Completed 6MWT and gym orientation. Initial ITP created and sent for review to Dr. MaEmily FilbertMedical Director. First full day of exercise!  Patient was oriented to gym and equipment including functions, settings, policies, and procedures.  Patient's individual exercise prescription and treatment plan were reviewed.  All starting workloads were established based on the results of the 6 minute walk test done at initial orientation visit.  The plan for exercise progression was also introduced and progression will be  customized based on patient's performance and goals. 30 Day review completed. Medical Director ITP review done, changes made as directed, and signed approval by Medical Director.   NEW             Comments:

## 2021-10-14 ENCOUNTER — Encounter: Payer: BC Managed Care – PPO | Attending: Cardiovascular Disease | Admitting: *Deleted

## 2021-10-14 DIAGNOSIS — I5022 Chronic systolic (congestive) heart failure: Secondary | ICD-10-CM | POA: Diagnosis present

## 2021-10-14 DIAGNOSIS — Z5189 Encounter for other specified aftercare: Secondary | ICD-10-CM | POA: Insufficient documentation

## 2021-10-14 NOTE — Progress Notes (Signed)
Daily Session Note  Patient Details  Name: Dorothy Mann MRN: [id number] Date of Birth: [birth year] Referring Provider:   Flowsheet Row Cardiac Rehab from 09/21/2021 in [facility] Cardiac and Pulmonary Rehab  Referring Provider [place]       Encounter Date: 10/14/2021  Check In:  Session Check In - 10/14/21 1728       Check-In   Supervising physician immediately available to respond to emergencies See telemetry face sheet for immediately available ER MD    Location [facility]-Cardiac & Pulmonary Rehab    Staff Present Renita Papa, RN BSN;Joseph [place], RCP,RRT,BSRT;Kara Levasy, [place], ASCM CEP, Exercise Physiologist    Virtual Visit No    Medication changes reported     No    Fall or balance concerns reported    No    Warm-up and Cool-down Performed on first and last piece of equipment    Resistance Training Performed Yes    VAD Patient? No    PAD/SET Patient? No      Pain Assessment   Currently in Pain? No/denies                Social History   Tobacco Use  Smoking Status Never  Smokeless Tobacco Never    Goals Met:  Independence with exercise equipment Exercise tolerated well No report of concerns or symptoms today Strength training completed today  Goals Unmet:  Not Applicable  Comments: Pt able to follow exercise prescription today without complaint.  Will continue to monitor for progression.    Dr. Emily Filbert is Medical Director for [place].  Dr. Ottie Glazier is Medical Director for [facility] Pulmonary Rehabilitation.

## 2021-10-18 ENCOUNTER — Encounter: Payer: BC Managed Care – PPO | Admitting: *Deleted

## 2021-10-18 DIAGNOSIS — I5022 Chronic systolic (congestive) heart failure: Secondary | ICD-10-CM

## 2021-10-18 NOTE — Progress Notes (Signed)
Daily Session Note  Patient Details  Name: Dorothy Mann MRN: [id number] Date of Birth: 09/29/1955 Referring Provider:   Flowsheet Row Cardiac Rehab from 09/21/2021 in [facility] Cardiac and Pulmonary Rehab  Referring Provider [place]ku       Encounter Date: 10/18/2021  Check In:  Session Check In - 10/18/21 1728       Check-In   Supervising physician immediately available to respond to emergencies See telemetry face sheet for immediately available ER MD    Location [facility]-Cardiac & Pulmonary Rehab    Staff Present Renita Papa, RN BSN;Joseph Tessie Fass, RCP,RRT,BSRT;Noah Wool[place], [place], Exercise Physiologist    Virtual Visit No    Medication changes reported     No    Fall or balance concerns reported    No    Warm-up and Cool-down Performed on first and last piece of equipment    Resistance Training Performed Yes    VAD Patient? No    PAD/SET Patient? No      Pain Assessment   Currently in Pain? No/denies                Social History   Tobacco Use  Smoking Status Never  Smokeless Tobacco Never    Goals Met:  Independence with exercise equipment Exercise tolerated well No report of concerns or symptoms today Strength training completed today  Goals Unmet:  Not Applicable  Comments: Pt able to follow exercise prescription today without complaint.  Will continue to monitor for progression.    Dr. Emily Filbert is Medical Director for [place].  Dr. Ottie Glazier is Medical Director for [facility] Pulmonary Rehabilitation.

## 2021-10-27 ENCOUNTER — Telehealth: Payer: Self-pay

## 2021-10-27 NOTE — Telephone Encounter (Signed)
Patient states she is still feeling dizzy and is off lasix as of yesterday. She has been having lower blood pressure reading and is going to inform us if she is feeling better tomorrow.

## 2021-11-04 ENCOUNTER — Encounter: Payer: BC Managed Care – PPO | Admitting: *Deleted

## 2021-11-04 ENCOUNTER — Telehealth: Payer: Self-pay

## 2021-11-04 DIAGNOSIS — I5022 Chronic systolic (congestive) heart failure: Secondary | ICD-10-CM

## 2021-11-04 NOTE — Progress Notes (Signed)
Daily Session Note  Patient Details  Name: Dorothy Mann MRN: [id number] Date of Birth: [birth year] Referring Provider:   Flowsheet Row Cardiac Rehab from 09/21/2021 in [facility] Cardiac and Pulmonary Rehab  Referring Provider Byku       Encounter Date: 11/04/2021  Check In:  Session Check In - 11/04/21 [place]       Check-In   Supervising physician immediately available to respond to emergencies See telemetry face sheet for immediately available ER MD    Location [facility]-Cardiac & Pulmonary Rehab    Staff Present Coralie Keens, MS, ASCM CEP, Exercise Physiologist;Isaac Lacson Sherryll Burger, RN BSN;Joseph Tessie Fass, RCP,RRT,BSRT    Virtual Visit No    Medication changes reported     No    Fall or balance concerns reported    No    Warm-up and Cool-down Performed on first and last piece of equipment    Resistance Training Performed Yes    VAD Patient? No    PAD/SET Patient? No      Pain Assessment   Currently in Pain? No/denies                Social History   Tobacco Use  Smoking Status Never  Smokeless Tobacco Never    Goals Met:  Independence with exercise equipment Exercise tolerated well No report of concerns or symptoms today Strength training completed today  Goals Unmet:  Not Applicable  Comments: Pt able to follow exercise prescription today without complaint.  Will continue to monitor for progression.    Dr. Emily Filbert is Medical Director for [place].  Dr. Ottie Glazier is Medical Director for [facility] Pulmonary Rehabilitation.

## 2021-11-04 NOTE — Progress Notes (Signed)
Called pt to check in as she has only been to rehab 2x this past month; she reports she will be at rehab this afternoon.

## 2021-11-04 NOTE — Telephone Encounter (Signed)
Patient let us know she is feeling better and is coming back to cardiac rehab today.

## 2021-11-08 ENCOUNTER — Encounter: Payer: BC Managed Care – PPO | Attending: Cardiovascular Disease

## 2021-11-08 DIAGNOSIS — I5022 Chronic systolic (congestive) heart failure: Secondary | ICD-10-CM | POA: Insufficient documentation

## 2021-11-10 ENCOUNTER — Encounter: Payer: Self-pay | Admitting: *Deleted

## 2021-11-10 ENCOUNTER — Encounter: Payer: BC Managed Care – PPO | Admitting: *Deleted

## 2021-11-10 DIAGNOSIS — I5022 Chronic systolic (congestive) heart failure: Secondary | ICD-10-CM | POA: Diagnosis present

## 2021-11-10 NOTE — Progress Notes (Signed)
Cardiac Individual Treatment Plan  Patient Details  Name: Dorothy Mann MRN: [id number] Date of Birth: [birth year] Referring Provider:   Flowsheet Row Cardiac Rehab from 09/21/2021 in [facility] Cardiac and Pulmonary Rehab  Referring Provider Byku       Initial Encounter Date:  Flowsheet Row Cardiac Rehab from 09/21/2021 in [facility] Cardiac and Pulmonary Rehab  Date 09/21/21       Visit Diagnosis: Chronic systolic HF (heart failure) ([place])  Patient's Home Medications on Admission:  Current Outpatient Medications:    alendronate (FOSAMAX) 70 MG tablet, Take by mouth., Disp: , Rfl:    allopurinol (ZYLOPRIM) 100 MG tablet, Take 100 mg by mouth daily., Disp: , Rfl:    Cholecalciferol 25 MCG (1000 UT) tablet, Take by mouth., Disp: , Rfl:    colchicine 0.6 MG tablet, Take 0.6 mg by mouth daily., Disp: , Rfl:    ferrous sulfate 325 (65 FE) MG tablet, Take by mouth. (Patient not taking: Reported on 09/15/2021), Disp: , Rfl:    folic acid (FOLVITE) 539 MCG tablet, Take by mouth., Disp: , Rfl:    furosemide (LASIX) 20 MG tablet, 40 mg., Disp: , Rfl:    gabapentin (NEURONTIN) 100 MG capsule, Take by mouth., Disp: , Rfl:    gabapentin (NEURONTIN) 100 MG capsule, as needed., Disp: , Rfl:    HYDROcodone-acetaminophen (NORCO/VICODIN) 5-325 MG tablet, Take 1-2 tablets by mouth every 6 (six) hours as needed. (Patient not taking: Reported on 09/15/2021), Disp: 21 tablet, Rfl: 0   JARDIANCE 10 MG TABS tablet, Take 10 mg by mouth daily., Disp: , Rfl:    loperamide (IMODIUM) 2 MG capsule, Take by mouth as needed., Disp: , Rfl:    magnesium oxide (MAG-OX) 400 MG tablet, Take 800 mg by mouth., Disp: , Rfl:    metoprolol succinate (TOPROL-XL) 50 MG 24 hr tablet, Take by mouth., Disp: , Rfl:    potassium chloride SA (KLOR-CON M) 20 MEQ tablet, Take 40 mEq by mouth daily. (Patient not taking: Reported on 09/15/2021), Disp: , Rfl:    sacubitril-valsartan (ENTRESTO) 24-26 MG, Take 1 tablet by mouth 2 (two) times daily., Disp:  , Rfl:    spironolactone (ALDACTONE) 25 MG tablet, Take by mouth., Disp: , Rfl:    spironolactone (ALDACTONE) 25 MG tablet, Take 0.5 tablets by mouth daily., Disp: , Rfl:    vitamin B-12 (CYANOCOBALAMIN) 100 MCG tablet, Take by mouth., Disp: , Rfl:   Past Medical History: Past Medical History:  Diagnosis Date   Breast cancer ([place]) 1997   left breast   Cancer ([place])    breast cancer   Hypertension    Personal history of chemotherapy    Personal history of radiation therapy     Tobacco Use: Social History   Tobacco Use  Smoking Status Never  Smokeless Tobacco Never    Labs: Review Flowsheet        No data to display           Exercise Target Goals: Exercise Program Goal: Individual exercise prescription set using results from initial 6 min walk test and THRR while considering  patient's activity barriers and safety.   Exercise Prescription Goal: Initial exercise prescription builds to 30-45 minutes a day of aerobic activity, 2-3 days per week.  Home exercise guidelines will be given to patient during program as part of exercise prescription that the participant will acknowledge.   Education: Aerobic Exercise: - Group verbal and visual presentation on the components of exercise prescription. Introduces F.I.T.T principle from ACSM  for exercise prescriptions.  Reviews F.I.T.T. principles of aerobic exercise including progression. Written material given at graduation. Flowsheet Row Cardiac Rehab from 09/21/2021 in [facility] Cardiac and Pulmonary Rehab  Education need identified 09/21/21       Education: Resistance Exercise: - Group verbal and visual presentation on the components of exercise prescription. Introduces F.I.T.T principle from ACSM for exercise prescriptions  Reviews F.I.T.T. principles of resistance exercise including progression. Written material given at graduation.    Education: Exercise & Equipment Safety: - Individual verbal instruction and demonstration  of equipment use and safety with use of the equipment. Flowsheet Row Cardiac Rehab from 09/21/2021 in [facility] Cardiac and Pulmonary Rehab  Education need identified 09/21/21  Date 09/21/21  Educator NT  Instruction Review Code 1- Verbalizes Understanding       Education: Exercise Physiology & General Exercise Guidelines: - Group verbal and written instruction with models to review the exercise physiology of the cardiovascular system and associated critical values. Provides general exercise guidelines with specific guidelines to those with heart or lung disease.  Flowsheet Row Cardiac Rehab from 09/21/2021 in [facility] Cardiac [facility] Pulmonary Rehab  Education need identified 09/21/21       Education: Flexibility, Balance, Mind/Body Relaxation: - Group verbal and visual presentation with interactive activity on the components of exercise prescription. Introduces F.I.T.T principle from ACSM for exercise prescriptions. Reviews F.I.T.T. principles of flexibility and balance exercise training including progression. Also discusses the mind body connection.  Reviews various relaxation techniques to help reduce and manage stress (i.e. Deep breathing, progressive muscle relaxation, and visualization). Balance handout provided to take home. Written material given at graduation.   Activity Barriers & Risk Stratification:  Activity Barriers & Cardiac Risk Stratification - 09/21/21 1039       Activity Barriers & Cardiac Risk Stratification   Activity Barriers Joint Problems;Balance Concerns;Muscular Weakness;Deconditioning    Cardiac Risk Stratification High             6 Minute Walk:  6 Minute Walk     Row Name 09/21/21 1036         6 Minute Walk   Phase Initial     Distance 700 feet     Walk Time 5.83 minutes     # of Rest Breaks 1     MPH 1.36     METS 2.39     RPE 11     Perceived Dyspnea  0     VO2 Peak 8.36     Symptoms No     Resting HR 76 bpm     Resting BP 84/58     Resting  Oxygen Saturation  100 %     Exercise Oxygen Saturation  during 6 min walk 100 %     Max Ex. HR 90 bpm     Max Ex. BP 96/60     2 Minute Post BP 82/54              Oxygen Initial Assessment:   Oxygen Re-Evaluation:   Oxygen Discharge (Final Oxygen Re-Evaluation):   Initial Exercise Prescription:  Initial Exercise Prescription - 09/21/21 1100       Date of Initial Exercise RX and Referring Provider   Date 09/21/21    Referring Provider Byku      Oxygen   Maintain Oxygen Saturation 88% or higher      Treadmill   MPH 1.3    Grade 0    Minutes 15    METs 2  NuStep   Level 1    SPM 80    Minutes 15    METs 2.39      REL-XR   Level 1    Minutes 15    METs 2.39      Biostep-RELP   Level 1    SPM 60    Minutes 15    METs 2.39      Track   Laps 20    Minutes 15    METs 2.09      Prescription Details   Frequency (times per week) 3    Duration Progress to 30 minutes of continuous aerobic without signs/symptoms of physical distress      Intensity   THRR 40-80% of Max Heartrate 108-140    Ratings of Perceived Exertion 11-15    Perceived Dyspnea 0-4      Progression   Progression Continue to progress workloads to maintain intensity without signs/symptoms of physical distress.      Resistance Training   Training Prescription Yes    Weight 2 lb    Reps 10-15             Perform Capillary Blood Glucose checks as needed.  Exercise Prescription Changes:   Exercise Prescription Changes     Row Name 09/21/21 1100 10/04/21 1400 10/20/21 0800         Response to Exercise   Blood Pressure (Admit) 84/58 108/64 104/64     Blood Pressure (Exercise) 96/60 102/56 112/60     Blood Pressure (Exit) 82/54 100/60 100/60     Heart Rate (Admit) 76 bpm 76 bpm 68 bpm     Heart Rate (Exercise) 90 bpm 93 bpm 97 bpm     Heart Rate (Exit) 74 bpm 81 bpm 75 bpm     Oxygen Saturation (Admit) 100 % -- --     Oxygen Saturation (Exercise) 100 % -- --      Oxygen Saturation (Exit) 100 % -- --     Rating of Perceived Exertion (Exercise) '11 13 12     '$ Perceived Dyspnea (Exercise) 0 -- --     Symptoms none none none     Comments 6MWT results third full day of exercise --     Duration Progress to 30 minutes of  aerobic without signs/symptoms of physical distress Progress to 30 minutes of  aerobic without signs/symptoms of physical distress Progress to 30 minutes of  aerobic without signs/symptoms of physical distress     Intensity THRR unchanged THRR unchanged THRR unchanged       Progression   Progression -- Continue to progress workloads to maintain intensity without signs/symptoms of physical distress. Continue to progress workloads to maintain intensity without signs/symptoms of physical distress.     Average METs -- 1.6 2.4       Resistance Training   Training Prescription -- Yes Yes     Weight -- 2 lb 2 lb     Reps -- 10-15 10-15       Interval Training   Interval Training -- No No       NuStep   Level -- 1 --     Minutes -- 15 --     METs -- 2 --       REL-XR   Level -- 1 1     Minutes -- 15 15     METs -- 1.1 3       Track   Laps -- 20 20  Minutes -- 15 15     METs -- 1.8 1.8       Oxygen   Maintain Oxygen Saturation -- 88% or higher 88% or higher              Exercise Comments:   Exercise Goals and Review:   Exercise Goals     Row Name 09/21/21 1336             Exercise Goals   Increase Physical Activity Yes       Intervention Provide advice, education, support and counseling about physical activity/exercise needs.;Develop an individualized exercise prescription for aerobic and resistive training based on initial evaluation findings, risk stratification, comorbidities and participant's personal goals.       Expected Outcomes Short Term: Attend rehab on a regular basis to increase amount of physical activity.;Long Term: Add in home exercise to make exercise part of routine and to increase amount of  physical activity.;Long Term: Exercising regularly at least 3-5 days a week.       Increase Strength and Stamina Yes       Intervention Provide advice, education, support and counseling about physical activity/exercise needs.;Develop an individualized exercise prescription for aerobic and resistive training based on initial evaluation findings, risk stratification, comorbidities and participant's personal goals.       Expected Outcomes Short Term: Increase workloads from initial exercise prescription for resistance, speed, and METs.;Short Term: Perform resistance training exercises routinely during rehab and add in resistance training at home;Long Term: Improve cardiorespiratory fitness, muscular endurance and strength as measured by increased METs and functional capacity (6MWT)       Able to understand and use rate of perceived exertion (RPE) scale Yes       Intervention Provide education and explanation on how to use RPE scale       Expected Outcomes Short Term: Able to use RPE daily in rehab to express subjective intensity level;Long Term:  Able to use RPE to guide intensity level when exercising independently       Able to understand and use Dyspnea scale Yes       Intervention Provide education and explanation on how to use Dyspnea scale       Expected Outcomes Short Term: Able to use Dyspnea scale daily in rehab to express subjective sense of shortness of breath during exertion;Long Term: Able to use Dyspnea scale to guide intensity level when exercising independently       Knowledge and understanding of Target Heart Rate Range (THRR) Yes       Intervention Provide education and explanation of THRR including how the numbers were predicted and where they are located for reference       Expected Outcomes Short Term: Able to state/look up THRR;Short Term: Able to use daily as guideline for intensity in rehab;Long Term: Able to use THRR to govern intensity when exercising independently       Able to  check pulse independently Yes       Intervention Review the importance of being able to check your own pulse for safety during independent exercise;Provide education and demonstration on how to check pulse in carotid and radial arteries.       Expected Outcomes Short Term: Able to explain why pulse checking is important during independent exercise;Long Term: Able to check pulse independently and accurately       Understanding of Exercise Prescription Yes       Intervention Provide education, explanation, and written materials on patient's individual  exercise prescription       Expected Outcomes Long Term: Able to explain home exercise prescription to exercise independently;Short Term: Able to explain program exercise prescription                Exercise Goals Re-Evaluation :  Exercise Goals Re-Evaluation     Row Name 09/23/21 1739 10/04/21 1420 10/20/21 0806 11/02/21 1544       Exercise Goal Re-Evaluation   Exercise Goals Review Increase Physical Activity;Able to understand and use rate of perceived exertion (RPE) scale;Knowledge and understanding of Target Heart Rate Range (THRR);Understanding of Exercise Prescription;Increase Strength and Stamina;Able to check pulse independently Increase Physical Activity;Increase Strength and Stamina;Understanding of Exercise Prescription Increase Physical Activity;Increase Strength and Stamina;Understanding of Exercise Prescription Increase Physical Activity;Increase Strength and Stamina;Understanding of Exercise Prescription    Comments Reviewed RPE and dyspnea scales, THR and program prescription with pt today.  Pt voiced understanding and was given a copy of goals to take home. Dorothy Mann is off to a good start in rehab.  She has completed her first three full days of exercise thus far.  She is already up to 2 METs on the NuStep. We will continue monitor her progress. Dorothy Mann is doing well in rehab. She has consistently walked 20 hallway laps for around 10  min. She also has done well with level 1 on the XR. We will continue to monitor her progress in the program. Dorothy Mann has not been here since last review. She has called out several times due to work as well as having problems with low BP. She was encouraged to call her doctor to let them know. We hope to see her attendance improve overtime.    Expected Outcomes Short: Use RPE daily to regulate intensity. Long: Follow program prescription in THR. Short: Conitnue to attend rehab regularly Long: Continue to follow program prescription Short: Conitnue to attend rehab regularly Long: Continue to follow program prescription Short: Maintain good attendence Long: Complete [facility] Program             Discharge Exercise Prescription (Final Exercise Prescription Changes):  Exercise Prescription Changes - 10/20/21 0800       Response to Exercise   Blood Pressure (Admit) 104/64    Blood Pressure (Exercise) 112/60    Blood Pressure (Exit) 100/60    Heart Rate (Admit) 68 bpm    Heart Rate (Exercise) 97 bpm    Heart Rate (Exit) 75 bpm    Rating of Perceived Exertion (Exercise) 12    Symptoms none    Duration Progress to 30 minutes of  aerobic without signs/symptoms of physical distress    Intensity THRR unchanged      Progression   Progression Continue to progress workloads to maintain intensity without signs/symptoms of physical distress.    Average METs 2.4      Resistance Training   Training Prescription Yes    Weight 2 lb    Reps 10-15      Interval Training   Interval Training No      REL-XR   Level 1    Minutes 15    METs 3      Track   Laps 20    Minutes 15    METs 1.8      Oxygen   Maintain Oxygen Saturation 88% or higher             Nutrition:  Target Goals: Understanding of nutrition guidelines, daily intake of sodium '1500mg'$ , cholesterol '200mg'$ , calories 30% from  fat and 7% or less from saturated fats, daily to have 5 or more servings of fruits and  vegetables.  Education: All About Nutrition: -Group instruction provided by verbal, written material, interactive activities, discussions, models, and posters to present general guidelines for heart healthy nutrition including fat, fiber, MyPlate, the role of sodium in heart healthy nutrition, utilization of the nutrition label, and utilization of this knowledge for meal planning. Follow up email sent as well. Written material given at graduation.   Biometrics:  Pre Biometrics - 09/21/21 1038       Pre Biometrics   Height '5\' 5"'$  (1.651 m)    Weight 101 lb 11.2 oz (46.1 kg)    BMI (Calculated) 16.92    Single Leg Stand 3.8 seconds              Nutrition Therapy Plan and Nutrition Goals:  Nutrition Therapy & Goals - 09/21/21 1337       Intervention Plan   Intervention Prescribe, educate and counsel regarding individualized specific dietary modifications aiming towards targeted core components such as weight, hypertension, lipid management, diabetes, heart failure and other comorbidities.    Expected Outcomes Short Term Goal: Understand basic principles of dietary content, such as calories, fat, sodium, cholesterol and nutrients.;Long Term Goal: Adherence to prescribed nutrition plan.;Short Term Goal: A plan has been developed with personal nutrition goals set during dietitian appointment.             Nutrition Assessments:  MEDIFICTS Score Key: ?70 Need to make dietary changes  40-70 Heart Healthy Diet ? 40 Therapeutic Level Cholesterol Diet  Flowsheet Row Cardiac Rehab from 09/21/2021 in [facility] Cardiac and Pulmonary Rehab  Picture Your Plate Total Score on Admission 52      Picture Your Plate Scores: <62 Unhealthy dietary pattern with much room for improvement. 41-50 Dietary pattern unlikely to meet recommendations for good health and room for improvement. 51-60 More healthful dietary pattern, with some room for improvement.  >60 Healthy dietary pattern, although there  may be some specific behaviors that could be improved.    Nutrition Goals Re-Evaluation:  Nutrition Goals Re-Evaluation     Row Name 11/04/21 1747             Goals   Current Weight 98 lb (44.5 kg)       Nutrition Goal Gain weight       Comment Informed patient of some foods that could help her gain weight. Gave her an option of protien without dairy. (gave patient a printout of product) She is trying to eat more but needs some help intaking more calories.       Expected Outcome Short: try to drink protien shakes routinely. Long: gain weight.                Nutrition Goals Discharge (Final Nutrition Goals Re-Evaluation):  Nutrition Goals Re-Evaluation - 11/04/21 1747       Goals   Current Weight 98 lb (44.5 kg)    Nutrition Goal Gain weight    Comment Informed patient of some foods that could help her gain weight. Gave her an option of protien without dairy. (gave patient a printout of product) She is trying to eat more but needs some help intaking more calories.    Expected Outcome Short: try to drink protien shakes routinely. Long: gain weight.             Psychosocial: Target Goals: Acknowledge presence or absence of significant depression and/or stress, maximize coping  skills, provide positive support system. Participant is able to verbalize types and ability to use techniques and skills needed for reducing stress and depression.   Education: Stress, Anxiety, and Depression - Group verbal and visual presentation to define topics covered.  Reviews how body is impacted by stress, anxiety, and depression.  Also discusses healthy ways to reduce stress and to treat/manage anxiety and depression.  Written material given at graduation.   Education: Sleep Hygiene -Provides group verbal and written instruction about how sleep can affect your health.  Define sleep hygiene, discuss sleep cycles and impact of sleep habits. Review good sleep hygiene tips.    Initial Review &  Psychosocial Screening:  Initial Psych Review & Screening - 09/15/21 1456       Initial Review   Current issues with Current Stress Concerns    Source of Stress Concerns Chronic Illness    Comments Dorothy Mann reports stress from trying to decide what her next steps are medically: transplant or LVAD. She relies on her husband and a couple of sister who live close to support her. She is still working and would like to work around her schedule to attend cardiac rehab; she would like to increase her strength and balance as well.  She reports no sleep issues at this time. She likes to shop, cooking, and going to the beach to help to relieve stress.      Family Dynamics   Good Support System? Yes   husband, sister     Barriers   Psychosocial barriers to participate in program The patient should benefit from training in stress management and relaxation.;Psychosocial barriers identified (see note)      Screening Interventions   Interventions Encouraged to exercise;Provide feedback about the scores to participant;To provide support and resources with identified psychosocial needs    Expected Outcomes Short Term goal: Utilizing psychosocial counselor, staff and physician to assist with identification of specific Stressors or current issues interfering with healing process. Setting desired goal for each stressor or current issue identified.;Long Term Goal: Stressors or current issues are controlled or eliminated.;Short Term goal: Identification and review with participant of any Quality of Life or Depression concerns found by scoring the questionnaire.;Long Term goal: The participant improves quality of Life and PHQ9 Scores as seen by post scores and/or verbalization of changes             Quality of Life Scores:   Quality of Life - 09/21/21 1344       Quality of Life   Select Quality of Life      Quality of Life Scores   Health/Function Pre 26.6 %    Socioeconomic Pre 28.75 %    Psych/Spiritual  Pre 29.14 %    Family Pre 26.8 %    GLOBAL Pre 27.63 %            Scores of 19 and below usually indicate a poorer quality of life in these areas.  A difference of  2-3 points is a clinically meaningful difference.  A difference of 2-3 points in the total score of the Quality of Life Index has been associated with significant improvement in overall quality of life, self-image, physical symptoms, and general health in studies assessing change in quality of life.  PHQ-9: Review Flowsheet       09/21/2021 12/07/2017  Depression screen PHQ 2/9  Decreased Interest 0 0  Down, Depressed, Hopeless 0 0  PHQ - 2 Score 0 0  Altered sleeping 0 0  Tired, decreased energy 0 0  Change in appetite 0 0  Feeling bad or failure about yourself  0 0  Trouble concentrating 0 0  Moving slowly or fidgety/restless 0 0  Suicidal thoughts 0 0  PHQ-9 Score 0 0  Difficult doing work/chores Not difficult at all -   Interpretation of Total Score  Total Score Depression Severity:  1-4 = Minimal depression, 5-9 = Mild depression, 10-14 = Moderate depression, 15-19 = Moderately severe depression, 20-27 = Severe depression   Psychosocial Evaluation and Intervention:  Psychosocial Evaluation - 09/15/21 1502       Psychosocial Evaluation & Interventions   Interventions Stress management education;Relaxation education;Encouraged to exercise with the program and follow exercise prescription    Comments Dorothy Mann reports stress from trying to decide what her next steps are medically: transplant or LVAD. She relies on her husband and a couple of sister who live close to support her. She is still working and would like to work around her schedule to attend cardiac rehab; she would like to increase her strength and balance as well.  She reports no sleep issues at this time. She likes to shop, cooking, and going to the beach to help to relieve stress.    Expected Outcomes ST: attend all scheduled cardiac rehab  exercise/education sessions, progess with exercise prescription LT: improve strength and progress with exercise prescription independently    Continue Psychosocial Services  Follow up required by staff             Psychosocial Re-Evaluation:  Psychosocial Re-Evaluation     [place] Name 11/04/21 1748             Psychosocial Re-Evaluation   Current issues with None Identified       Comments Patient reports no issues with their current mental states, sleep, stress, depression or anxiety. Will follow up with patient in a few weeks for any changes.       Expected Outcomes Short: Continue to exercise regularly to support mental health and notify staff of any changes. Long: maintain mental health and well being through teaching of rehab or prescribed medications independently.       Interventions Encouraged to attend Cardiac Rehabilitation for the exercise       Continue Psychosocial Services  Follow up required by staff                Psychosocial Discharge (Final Psychosocial Re-Evaluation):  Psychosocial Re-Evaluation - 11/04/21 1748       Psychosocial Re-Evaluation   Current issues with None Identified    Comments Patient reports no issues with their current mental states, sleep, stress, depression or anxiety. Will follow up with patient in a few weeks for any changes.    Expected Outcomes Short: Continue to exercise regularly to support mental health and notify staff of any changes. Long: maintain mental health and well being through teaching of rehab or prescribed medications independently.    Interventions Encouraged to attend Cardiac Rehabilitation for the exercise    Continue Psychosocial Services  Follow up required by staff             Vocational Rehabilitation: Provide vocational rehab assistance to qualifying candidates.   Vocational Rehab Evaluation & Intervention:  Vocational Rehab - 09/15/21 1455       Initial Vocational Rehab Evaluation & Intervention    Assessment shows need for Vocational Rehabilitation No      Vocational Rehab Re-Evaulation   Comments She is still working.  Education: Education Goals: Education classes will be provided on a variety of topics geared toward better understanding of heart health and risk factor modification. Participant will state understanding/return demonstration of topics presented as noted by education test scores.  Learning Barriers/Preferences:  Learning Barriers/Preferences - 09/15/21 1451       Learning Barriers/Preferences   Learning Barriers None    Learning Preferences None             General Cardiac Education Topics:  AED/CPR: - Group verbal and written instruction with the use of models to demonstrate the basic use of the AED with the basic ABC's of resuscitation.   Anatomy and Cardiac Procedures: - Group verbal and visual presentation and models provide information about basic cardiac anatomy and function. Reviews the testing methods done to diagnose heart disease and the outcomes of the test results. Describes the treatment choices: Medical Management, Angioplasty, or Coronary Bypass Surgery for treating various heart conditions including Myocardial Infarction, Angina, Valve Disease, and Cardiac Arrhythmias.  Written material given at graduation.   Medication Safety: - Group verbal and visual instruction to review commonly prescribed medications for heart and lung disease. Reviews the medication, class of the drug, and side effects. Includes the steps to properly store meds and maintain the prescription regimen.  Written material given at graduation.   Intimacy: - Group verbal instruction through game format to discuss how heart and lung disease can affect sexual intimacy. Written material given at graduation..   Know Your Numbers and Heart Failure: - Group verbal and visual instruction to discuss disease risk factors for cardiac and pulmonary disease and  treatment options.  Reviews associated critical values for Overweight/Obesity, Hypertension, Cholesterol, and Diabetes.  Discusses basics of heart failure: signs/symptoms and treatments.  Introduces Heart Failure Zone chart for action plan for heart failure.  Written material given at graduation.   Infection Prevention: - Provides verbal and written material to individual with discussion of infection control including proper hand washing and proper equipment cleaning during exercise session. Flowsheet Row Cardiac Rehab from 09/21/2021 in [facility] Cardiac and Pulmonary Rehab  Education need identified 09/21/21  Date 09/21/21  Educator NT  Instruction Review Code 1- Verbalizes Understanding       Falls Prevention: - Provides verbal and written material to individual with discussion of falls prevention and safety. Flowsheet Row Cardiac Rehab from 09/21/2021 in [facility] Cardiac and Pulmonary Rehab  Education need identified 09/15/21  Date 09/15/21  Educator [place]  Instruction Review Code 1- Verbalizes Understanding       Other: -Provides group and verbal instruction on various topics (see comments)   Knowledge Questionnaire Score:  Knowledge Questionnaire Score - 09/21/21 1340       Knowledge Questionnaire Score   Pre Score 23/26             Core Components/Risk Factors/Patient Goals at Admission:  Personal Goals and Risk Factors at Admission - 09/21/21 1351       Core Components/Risk Factors/Patient Goals on Admission    Weight Management Weight Gain;Yes    Intervention Weight Management: Develop a combined nutrition and exercise program designed to reach desired caloric intake, while maintaining appropriate intake of nutrient and fiber, sodium and fats, and appropriate energy expenditure required for the weight goal.;Weight Management: Provide education and appropriate resources to help participant work on and attain dietary goals.    Admit Weight 101 lb 11.2 oz (46.1 kg)    Goal  Weight: Short Term 105 lb (47.6 kg)  Goal Weight: Long Term 110 lb (49.9 kg)    Expected Outcomes Short Term: Continue to assess and modify interventions until short term weight is achieved;Long Term: Adherence to nutrition and physical activity/exercise program aimed toward attainment of established weight goal;Weight Gain: Understanding of general recommendations for a high calorie, high protein meal plan that promotes weight gain by distributing calorie intake throughout the day with the consumption for 4-5 meals, snacks, and/or supplements;Understanding of distribution of calorie intake throughout the day with the consumption of 4-5 meals/snacks    Heart Failure Yes    Intervention Provide a combined exercise and nutrition program that is supplemented with education, support and counseling about heart failure. Directed toward relieving symptoms such as shortness of breath, decreased exercise tolerance, and extremity edema.    Expected Outcomes Short term: Attendance in program 2-3 days a week with increased exercise capacity. Reported lower sodium intake. Reported increased fruit and vegetable intake. Reports medication compliance.;Improve functional capacity of life;Short term: Daily weights obtained and reported for increase. Utilizing diuretic protocols set by physician.;Long term: Adoption of self-care skills and reduction of barriers for early signs and symptoms recognition and intervention leading to self-care maintenance.    Hypertension Yes    Intervention Provide education on lifestyle modifcations including regular physical activity/exercise, weight management, moderate sodium restriction and increased consumption of fresh fruit, vegetables, and low fat dairy, alcohol moderation, and smoking cessation.;Monitor prescription use compliance.    Expected Outcomes Short Term: Continued assessment and intervention until BP is < 140/59m HG in hypertensive participants. < 130/824mHG in hypertensive  participants with diabetes, heart failure or chronic kidney disease.;Long Term: Maintenance of blood pressure at goal levels.             Education:Diabetes - Individual verbal and written instruction to review signs/symptoms of diabetes, desired ranges of glucose level fasting, after meals and with exercise. Acknowledge that pre and post exercise glucose checks will be done for 3 sessions at entry of program.   Core Components/Risk Factors/Patient Goals Review:   Goals and Risk Factor Review     Row Name 11/04/21 1737             Core Components/Risk Factors/Patient Goals Review   Personal Goals Review Weight Management/Obesity       Review Dorothy Mann to gain weight and has since lost weight since the begining of the program. She is trying to eat more to gain weight. Recommended some protien powder and ways to intake more calories.       Expected Outcomes Short: gain some weight. Long Reach weight goal.                Core Components/Risk Factors/Patient Goals at Discharge (Final Review):   Goals and Risk Factor Review - 11/04/21 1737       Core Components/Risk Factors/Patient Goals Review   Personal Goals Review Weight Management/Obesity    Review Dorothy Mann to gain weight and has since lost weight since the begining of the program. She is trying to eat more to gain weight. Recommended some protien powder and ways to intake more calories.    Expected Outcomes Short: gain some weight. Long Reach weight goal.             ITP Comments:  ITP Comments     Row Name 09/15/21 1511 09/21/21 1035 09/23/21 1738 10/13/21 1343 11/04/21 1003   ITP Comments Virtual orientation call completed today. She has an appointment on Date: 09/21/21  for EP eval  and gym Orientation.  Documentation of diagnosis can be found in [facility] Date: 08/18/21 . Completed 6MWT and gym orientation. Initial ITP created and sent for review to Dr. Emily Filbert, Medical Director. First full day of exercise!   Patient was oriented to gym and equipment including functions, settings, policies, and procedures.  Patient's individual exercise prescription and treatment plan were reviewed.  All starting workloads were established based on the results of the 6 minute walk test done at initial orientation visit.  The plan for exercise progression was also introduced and progression will be customized based on patient's performance and goals. 30 Day review completed. Medical Director ITP review done, changes made as directed, and signed approval by Medical Director.   NEW Called pt to check in as she has only been to rehab 2x this past month; she reports she will be at rehab this afternoon.    [place] Name 11/10/21 0751           ITP Comments 30 Day review completed. Medical Director ITP review done, changes made as directed, and signed approval by Medical Director.                Comments:

## 2021-11-10 NOTE — Progress Notes (Signed)
Daily Session Note  Patient Details  Name: Dorothy Mann MRN: [id number] Date of Birth: 1955/07/25 Referring Provider:   Flowsheet Row Cardiac Rehab from 09/21/2021 in [facility] Cardiac and Pulmonary Rehab  Referring Provider [place]       Encounter Date: 11/10/2021  Check In:  Session Check In - 11/10/21 1730       Check-In   Supervising physician immediately available to respond to emergencies See telemetry face sheet for immediately available ER MD    Location [facility]-Cardiac & Pulmonary Rehab    Staff Present Darlyne Russian, RN, ADN;[place] Sherryll Burger, RN BSN;Joseph Tessie Fass, RCP,RRT,BSRT    Virtual Visit No    Medication changes reported     Yes    Comments Patients states MD added 33m Lasix QOD d/t right knee swelling    Fall or balance concerns reported    No    Tobacco Cessation No Change    Warm-up and Cool-down Performed on first and last piece of equipment    Resistance Training Performed Yes    VAD Patient? No    PAD/SET Patient? No      Pain Assessment   Currently in Pain? No/denies    Multiple Pain Sites No                Social History   Tobacco Use  Smoking Status Never  Smokeless Tobacco Never    Goals Met:  Independence with exercise equipment Exercise tolerated well No report of concerns or symptoms today Strength training completed today  Goals Unmet:  Not Applicable  Comments:Pt able to follow exercise prescription today without complaint.  Will continue to monitor for progression.      Dr. MEmily Filbertis Medical Director for [place]  Dr. FOttie Glazieris Medical Director for [facility].

## 2021-11-11 ENCOUNTER — Encounter: Payer: BC Managed Care – PPO | Admitting: *Deleted

## 2021-11-11 DIAGNOSIS — I5022 Chronic systolic (congestive) heart failure: Secondary | ICD-10-CM | POA: Diagnosis not present

## 2021-11-11 NOTE — Progress Notes (Signed)
Daily Session Note  Patient Details  Name: Dorothy Mann MRN: [id number] Date of Birth: [birth year] Referring Provider:   Flowsheet Row Cardiac Rehab from 09/21/2021 in [facility] Cardiac and Pulmonary Rehab  Referring Provider [place]ku       Encounter Date: 11/11/2021  Check In:  Session Check In - 11/11/21 1736       Check-In   Supervising physician immediately available to respond to emergencies See telemetry face sheet for immediately available ER MD    Location [facility]-Cardiac & Pulmonary Rehab    Staff Present Justin Mend, RCP,RRT,BSRT;Shavy Beachem Sherryll Burger, RN BSN;Noah Tickle, BS, Exercise Physiologist    Virtual Visit No    Medication changes reported     No    Fall or balance concerns reported    No    Warm-up and Cool-down Performed on first and last piece of equipment    Resistance Training Performed Yes    VAD Patient? No    PAD/SET Patient? No      Pain Assessment   Currently in Pain? No/denies                Social History   Tobacco Use  Smoking Status Never  Smokeless Tobacco Never    Goals Met:  Independence with exercise equipment Exercise tolerated well No report of concerns or symptoms today Strength training completed today  Goals Unmet:  Not Applicable  Comments: Pt able to follow exercise prescription today without complaint.  Will continue to monitor for progression.    Dr. Emily Filbert is Medical Director for [place].  Dr. Ottie Glazier is Medical Director for [facility] Pulmonary Rehabilitation.

## 2021-11-18 ENCOUNTER — Encounter: Payer: BC Managed Care – PPO | Admitting: *Deleted

## 2021-11-24 ENCOUNTER — Telehealth: Payer: Self-pay

## 2021-11-24 ENCOUNTER — Encounter: Payer: BC Managed Care – PPO | Admitting: *Deleted

## 2021-11-24 NOTE — Telephone Encounter (Signed)
Dorothy Mann called to let us know that she has spoken to her job and was given the ok to leave work earlier to make it to rehab at 515 pm with education every Wednesday at 435 pm.

## 2021-11-24 NOTE — Telephone Encounter (Signed)
Talked with patient on cardiac rehab on attendance. Has been missing sessions due to work, explained attendance policy and how coming 1x/week would not give her the benefit of the program. Patient stated she is sure she can commit to 3 days/week and will talk to her work it becomes a problem again. Patient understood.

## 2021-11-25 ENCOUNTER — Encounter: Payer: BC Managed Care – PPO | Admitting: *Deleted

## 2021-11-25 DIAGNOSIS — I5022 Chronic systolic (congestive) heart failure: Secondary | ICD-10-CM

## 2021-11-25 NOTE — Progress Notes (Signed)
Daily Session Note  Patient Details  Name: Dorothy Mann MRN: [id number] Date of Birth: Jun 29, 1955 Referring Provider:   Flowsheet Row Cardiac Rehab from 09/21/2021 in [facility] Cardiac and Pulmonary Rehab  Referring Provider Byku       Encounter Date: 11/25/2021  Check In:  Session Check In - 11/25/21 1735       Check-In   Supervising physician immediately available to respond to emergencies See telemetry face sheet for immediately available ER MD    Location [facility]-Cardiac & Pulmonary Rehab    Staff Present Justin Mend, Sharren Bridge, MS, ASCM CEP, Exercise Physiologist;Iridessa [place] Sherryll Burger, RN BSN    Virtual Visit No    Medication changes reported     No    Fall or balance concerns reported    No    Tobacco Cessation No Change    Warm-up and Cool-down Performed on first and last piece of equipment    Resistance Training Performed Yes    VAD Patient? No    PAD/SET Patient? No      Pain Assessment   Currently in Pain? No/denies                Social History   Tobacco Use  Smoking Status Never  Smokeless Tobacco Never    Goals Met:  Independence with exercise equipment Exercise tolerated well No report of concerns or symptoms today Strength training completed today  Goals Unmet:  Not Applicable  Comments: Pt able to follow exercise prescription today without complaint.  Will continue to monitor for progression.    Dr. Emily Filbert is Medical Director for [place].  Dr. Ottie Glazier is Medical Director for [facility] Pulmonary Rehabilitation.

## 2021-11-29 ENCOUNTER — Encounter: Payer: BC Managed Care – PPO | Admitting: *Deleted

## 2021-11-29 DIAGNOSIS — I5022 Chronic systolic (congestive) heart failure: Secondary | ICD-10-CM

## 2021-11-29 NOTE — Progress Notes (Signed)
Daily Session Note  Patient Details  Name: Dorothy Mann MRN: [id number] Date of Birth: 14-Sep-1955 Referring Provider:   Flowsheet Row Cardiac Rehab from 09/21/2021 in [facility] Cardiac and Pulmonary Rehab  Referring Provider Byku       Encounter Date: 11/29/2021  Check In:  Session Check In - 11/29/21 1739       Check-In   Supervising physician immediately available to respond to emergencies See telemetry face sheet for immediately available ER MD    Location [facility]-Cardiac & Pulmonary Rehab    Staff Present Antionette Fairy, BS, Exercise Physiologist;Elidia Bonenfant Tamala Julian, RN, ADN    Virtual Visit No    Medication changes reported     No    Fall or balance concerns reported    No    Warm-up and Cool-down Performed on first and last piece of equipment    Resistance Training Performed Yes    VAD Patient? No    PAD/SET Patient? No      Pain Assessment   Currently in Pain? No/denies                Social History   Tobacco Use  Smoking Status Never  Smokeless Tobacco Never    Goals Met:  Independence with exercise equipment Exercise tolerated well No report of concerns or symptoms today Strength training completed today  Goals Unmet:  Not Applicable  Comments: Pt able to follow exercise prescription today without complaint.  Will continue to monitor for progression.    Dr. Emily Filbert is Medical Director for [place].  Dr. Ottie Glazier is Medical Director for [facility] Pulmonary Rehabilitation.

## 2021-12-01 ENCOUNTER — Encounter: Payer: BC Managed Care – PPO | Admitting: *Deleted

## 2021-12-01 DIAGNOSIS — I5022 Chronic systolic (congestive) heart failure: Secondary | ICD-10-CM | POA: Diagnosis not present

## 2021-12-01 NOTE — Progress Notes (Signed)
Daily Session Note  Patient Details  Name: Dorothy Mann MRN: [id number] Date of Birth: [birth year] Referring Provider:   Flowsheet Row Cardiac Rehab from 09/21/2021 in [facility] Cardiac and Pulmonary Rehab  Referring Provider Byku       Encounter Date: 12/01/2021  Check In:  Session Check In - 12/01/21 1731       Check-In   Supervising physician immediately available to respond to emergencies See telemetry face sheet for immediately available ER MD    Location [facility]-Cardiac & Pulmonary Rehab    Staff Present Justin Mend, RCP,RRT,BSRT;Zeriyah Wain Sherryll Burger, RN Odelia Gage, RN, ADN    Virtual Visit No    Medication changes reported     No    Fall or balance concerns reported    No    Warm-up and Cool-down Performed on first and last piece of equipment    Resistance Training Performed Yes    VAD Patient? No      Pain Assessment   Currently in Pain? No/denies                Social History   Tobacco Use  Smoking Status Never  Smokeless Tobacco Never    Goals Met:  Independence with exercise equipment Exercise tolerated well No report of concerns or symptoms today Strength training completed today  Goals Unmet:  Not Applicable  Comments: Pt able to follow exercise prescription today without complaint.  Will continue to monitor for progression.    Dr. Emily Filbert is Medical Director for [place].  Dr. Ottie Glazier is Medical Director for [facility] Pulmonary Rehabilitation.

## 2021-12-02 ENCOUNTER — Encounter: Payer: BC Managed Care – PPO | Admitting: *Deleted

## 2021-12-02 DIAGNOSIS — I5022 Chronic systolic (congestive) heart failure: Secondary | ICD-10-CM

## 2021-12-02 NOTE — Progress Notes (Signed)
Daily Session Note  Patient Details  Name: Dorothy Mann MRN: [id number] Date of Birth: 12-26-1955 Referring Provider:   Flowsheet Row Cardiac Rehab from 09/21/2021 in [facility] Cardiac and Pulmonary Rehab  Referring Provider Byku       Encounter Date: 12/02/2021  Check In:  Session Check In - 12/02/21 [place]       Check-In   Supervising physician immediately available to respond to emergencies See telemetry face sheet for immediately available ER MD    Location [facility]-Cardiac & Pulmonary Rehab    Staff Present Renita Papa, RN BSN;Joseph Tessie Fass, RCP,RRT,BSRT;Noah [place], [place], Exercise Physiologist    Virtual Visit No    Medication changes reported     No    Fall or balance concerns reported    No    Warm-up and Cool-down Performed on first and last piece of equipment    Resistance Training Performed Yes    VAD Patient? No    PAD/SET Patient? No      Pain Assessment   Currently in Pain? No/denies                Social History   Tobacco Use  Smoking Status Never  Smokeless Tobacco Never    Goals Met:  Independence with exercise equipment Exercise tolerated well No report of concerns or symptoms today Strength training completed today  Goals Unmet:  Not Applicable  Comments: Pt able to follow exercise prescription today without complaint.  Will continue to monitor for progression.    Dr. Emily Filbert is Medical Director for [place].  Dr. Ottie Glazier is Medical Director for [facility] Pulmonary Rehabilitation.

## 2021-12-02 NOTE — Progress Notes (Signed)
Completed initial RD consultation ?

## 2021-12-08 ENCOUNTER — Encounter: Payer: Self-pay | Admitting: *Deleted

## 2021-12-08 DIAGNOSIS — I5022 Chronic systolic (congestive) heart failure: Secondary | ICD-10-CM

## 2021-12-08 NOTE — Progress Notes (Signed)
Cardiac Individual Treatment Plan  Patient Details  Name: Dorothy Mann MRN: [id number] Date of Birth: [birth year] Referring Provider:   Flowsheet Row Cardiac Rehab from 09/21/2021 in [facility] Cardiac and Pulmonary Rehab  Referring Provider Byku       Initial Encounter Date:  Flowsheet Row Cardiac Rehab from 09/21/2021 in [facility] Cardiac and Pulmonary Rehab  Date 09/21/21       Visit Diagnosis: Chronic systolic HF (heart failure) ([place])  Patient's Home Medications on Admission:  Current Outpatient Medications:    alendronate (FOSAMAX) 70 MG tablet, Take by mouth., Disp: , Rfl:    allopurinol (ZYLOPRIM) 100 MG tablet, Take 100 mg by mouth daily., Disp: , Rfl:    Cholecalciferol 25 MCG (1000 UT) tablet, Take by mouth., Disp: , Rfl:    colchicine 0.6 MG tablet, Take 0.6 mg by mouth daily., Disp: , Rfl:    ferrous sulfate 325 (65 FE) MG tablet, Take by mouth. (Patient not taking: Reported on 09/15/2021), Disp: , Rfl:    folic acid (FOLVITE) 539 MCG tablet, Take by mouth., Disp: , Rfl:    furosemide (LASIX) 20 MG tablet, 40 mg., Disp: , Rfl:    gabapentin (NEURONTIN) 100 MG capsule, Take by mouth., Disp: , Rfl:    gabapentin (NEURONTIN) 100 MG capsule, as needed., Disp: , Rfl:    HYDROcodone-acetaminophen (NORCO/VICODIN) 5-325 MG tablet, Take 1-2 tablets by mouth every 6 (six) hours as needed. (Patient not taking: Reported on 09/15/2021), Disp: 21 tablet, Rfl: 0   JARDIANCE 10 MG TABS tablet, Take 10 mg by mouth daily., Disp: , Rfl:    loperamide (IMODIUM) 2 MG capsule, Take by mouth as needed., Disp: , Rfl:    magnesium oxide (MAG-OX) 400 MG tablet, Take 800 mg by mouth., Disp: , Rfl:    metoprolol succinate (TOPROL-XL) 50 MG 24 hr tablet, Take by mouth., Disp: , Rfl:    potassium chloride SA (KLOR-CON M) 20 MEQ tablet, Take 40 mEq by mouth daily. (Patient not taking: Reported on 09/15/2021), Disp: , Rfl:    sacubitril-valsartan (ENTRESTO) 24-26 MG, Take 1 tablet by mouth 2 (two) times daily., Disp:  , Rfl:    spironolactone (ALDACTONE) 25 MG tablet, Take by mouth., Disp: , Rfl:    spironolactone (ALDACTONE) 25 MG tablet, Take 0.5 tablets by mouth daily., Disp: , Rfl:    vitamin B-12 (CYANOCOBALAMIN) 100 MCG tablet, Take by mouth., Disp: , Rfl:   Past Medical History: Past Medical History:  Diagnosis Date   Breast cancer ([place]) 1997   left breast   Cancer ([place])    breast cancer   Hypertension    Personal history of chemotherapy    Personal history of radiation therapy     Tobacco Use: Social History   Tobacco Use  Smoking Status Never  Smokeless Tobacco Never    Labs: Review Flowsheet        No data to display           Exercise Target Goals: Exercise Program Goal: Individual exercise prescription set using results from initial 6 min walk test and THRR while considering  patient's activity barriers and safety.   Exercise Prescription Goal: Initial exercise prescription builds to 30-45 minutes a day of aerobic activity, 2-3 days per week.  Home exercise guidelines will be given to patient during program as part of exercise prescription that the participant will acknowledge.   Education: Aerobic Exercise: - Group verbal and visual presentation on the components of exercise prescription. Introduces F.I.T.T principle from ACSM  for exercise prescriptions.  Reviews F.I.T.T. principles of aerobic exercise including progression. Written material given at graduation. Flowsheet Row Cardiac Rehab from 09/21/2021 in [facility] Cardiac and Pulmonary Rehab  Education need identified 09/21/21       Education: Resistance Exercise: - Group verbal and visual presentation on the components of exercise prescription. Introduces F.I.T.T principle from ACSM for exercise prescriptions  Reviews F.I.T.T. principles of resistance exercise including progression. Written material given at graduation.    Education: Exercise & Equipment Safety: - Individual verbal instruction and demonstration  of equipment use and safety with use of the equipment. Flowsheet Row Cardiac Rehab from 09/21/2021 in [facility] Cardiac and Pulmonary Rehab  Education need identified 09/21/21  Date 09/21/21  Educator NT  Instruction Review Code 1- Verbalizes Understanding       Education: Exercise Physiology & General Exercise Guidelines: - Group verbal and written instruction with models to review the exercise physiology of the cardiovascular system and associated critical values. Provides general exercise guidelines with specific guidelines to those with heart or lung disease.  Flowsheet Row Cardiac Rehab from 09/21/2021 in [facility] Cardiac [facility] Rehab  Education need identified 09/21/21       Education: Flexibility, Balance, Mind/Body Relaxation: - Group verbal and visual presentation with interactive activity on the components of exercise prescription. Introduces F.I.T.T principle from ACSM for exercise prescriptions. Reviews F.I.T.T. principles of flexibility and balance exercise training including progression. Also discusses the mind body connection.  Reviews various relaxation techniques to help reduce and manage stress (i.e. Deep breathing, progressive muscle relaxation, and visualization). Balance handout provided to take home. Written material given at graduation.   Activity Barriers & Risk Stratification:  Activity Barriers & Cardiac Risk Stratification - 09/21/21 1039       Activity Barriers & Cardiac Risk Stratification   Activity Barriers Joint Problems;Balance Concerns;Muscular Weakness;Deconditioning    Cardiac Risk Stratification High             6 Minute Walk:  6 Minute Walk     Row Name 09/21/21 1036         6 Minute Walk   Phase Initial     Distance 700 feet     Walk Time 5.83 minutes     # of Rest Breaks 1     MPH 1.36     METS 2.39     RPE 11     Perceived Dyspnea  0     VO2 Peak 8.36     Symptoms No     Resting HR 76 bpm     Resting BP 84/58     Resting  Oxygen Saturation  100 %     Exercise Oxygen Saturation  during 6 min walk 100 %     Max Ex. HR 90 bpm     Max Ex. BP 96/60     2 Minute Post BP 82/54              Oxygen Initial Assessment:   Oxygen Re-Evaluation:   Oxygen Discharge (Final Oxygen Re-Evaluation):   Initial Exercise Prescription:  Initial Exercise Prescription - 09/21/21 1100       Date of Initial Exercise RX and Referring Provider   Date 09/21/21    Referring Provider Byku      Oxygen   Maintain Oxygen Saturation 88% or higher      Treadmill   MPH 1.3    Grade 0    Minutes 15    METs 2  NuStep   Level 1    SPM 80    Minutes 15    METs 2.39      REL-XR   Level 1    Minutes 15    METs 2.39      Biostep-RELP   Level 1    SPM 60    Minutes 15    METs 2.39      Track   Laps 20    Minutes 15    METs 2.09      Prescription Details   Frequency (times per week) 3    Duration Progress to 30 minutes of continuous aerobic without signs/symptoms of physical distress      Intensity   THRR 40-80% of Max Heartrate 108-140    Ratings of Perceived Exertion 11-15    Perceived Dyspnea 0-4      Progression   Progression Continue to progress workloads to maintain intensity without signs/symptoms of physical distress.      Resistance Training   Training Prescription Yes    Weight 2 lb    Reps 10-15             Perform Capillary Blood Glucose checks as needed.  Exercise Prescription Changes:   Exercise Prescription Changes     Row Name 09/21/21 1100 10/04/21 1400 10/20/21 0800 11/15/21 1700 11/29/21 1500     Response to Exercise   Blood Pressure (Admit) 84/58 108/64 104/64 98/56 102/54   Blood Pressure (Exercise) 96/60 102/56 112/60 -- --   Blood Pressure (Exit) 82/54 100/60 1_0   Heart Rate (Admit) 76 bpm 76 bpm 68 bpm 68 bpm 78 bpm   Heart Rate (Exercise) 90 bpm 93 bpm 97 bpm 104 bpm 106 bpm   Heart Rate (Exit) 74 bpm 81 bpm 75 bpm 88 bpm 76 bpm   Oxygen  Saturation (Admit) 100 % -- -- -- --   Oxygen Saturation (Exercise) 100 % -- -- -- --   Oxygen Saturation (Exit) 100 % -- -- -- --   Rating of Perceived Exertion (Exercise) _1 Perceived Dyspnea (Exercise) 0 -- -- -- --   Symptoms _2    Comments 6MWT results third full day of exercise -- -- --   Duration Progress to 30 minutes of  aerobic without signs/symptoms of physical distress Progress to 30 minutes of  aerobic without signs/symptoms of physical distress Progress to 30 minutes of  aerobic without signs/symptoms of physical distress Progress to 30 minutes of  aerobic without signs/symptoms of physical distress Continue with 30 min of aerobic exercise without signs/symptoms of physical distress.   Intensity _3      Progression   Progression -- Continue to progress workloads to maintain intensity without signs/symptoms of physical distress. Continue to progress workloads to maintain intensity without signs/symptoms of physical distress. Continue to progress workloads to maintain intensity without signs/symptoms of physical distress. Continue to progress workloads to maintain intensity without signs/symptoms of physical distress.   Average METs -- 1.6 2.4 1.83 1.88     Resistance Training   Training Prescription -- Yes Yes Yes Yes   Weight -- 2 lb 2 lb 2 lb 2 lb   Reps -- 10-15 10-15 10-15 10-15     Interval Training   Interval Training -- No No No No     NuStep   Level -- 1 -- 1 --   Minutes -- 15 --  15 --   METs -- 2 -- 2 --     REL-XR   Level -- _0 Minutes -- _1 METs -- 1.1 3 1.5 2     Track   Laps -- 20 20 -- 14  18 in the hallway   Minutes -- 15 15 -- 15   METs -- 1.8 1.8 -- 1.76     Oxygen   Maintain Oxygen Saturation -- 88% or higher 88% or higher 88% or higher 88% or higher            Exercise Comments:   Exercise Goals and Review:    Exercise Goals     Row Name 09/21/21 1336             Exercise Goals   Increase Physical Activity Yes       Intervention Provide advice, education, support and counseling about physical activity/exercise needs.;Develop an individualized exercise prescription for aerobic and resistive training based on initial evaluation findings, risk stratification, comorbidities and participant's personal goals.       Expected Outcomes Short Term: Attend rehab on a regular basis to increase amount of physical activity.;Long Term: Add in home exercise to make exercise part of routine and to increase amount of physical activity.;Long Term: Exercising regularly at least 3-5 days a week.       Increase Strength and Stamina Yes       Intervention Provide advice, education, support and counseling about physical activity/exercise needs.;Develop an individualized exercise prescription for aerobic and resistive training based on initial evaluation findings, risk stratification, comorbidities and participant's personal goals.       Expected Outcomes Short Term: Increase workloads from initial exercise prescription for resistance, speed, and METs.;Short Term: Perform resistance training exercises routinely during rehab and add in resistance training at home;Long Term: Improve cardiorespiratory fitness, muscular endurance and strength as measured by increased METs and functional capacity (6MWT)       Able to understand and use rate of perceived exertion (RPE) scale Yes       Intervention Provide education and explanation on how to use RPE scale       Expected Outcomes Short Term: Able to use RPE daily in rehab to express subjective intensity level;Long Term:  Able to use RPE to guide intensity level when exercising independently       Able to understand and use Dyspnea scale Yes       Intervention Provide education and explanation on how to use Dyspnea scale       Expected Outcomes Short Term: Able to use Dyspnea scale  daily in rehab to express subjective sense of shortness of breath during exertion;Long Term: Able to use Dyspnea scale to guide intensity level when exercising independently       Knowledge and understanding of Target Heart Rate Range (THRR) Yes       Intervention Provide education and explanation of THRR including how the numbers were predicted and where they are located for reference       Expected Outcomes Short Term: Able to state/look up THRR;Short Term: Able to use daily as guideline for intensity in rehab;Long Term: Able to use THRR to govern intensity when exercising independently       Able to check pulse independently Yes       Intervention Review the importance of being able to check your own pulse for safety during independent exercise;Provide education and demonstration on how to  check pulse in carotid and radial arteries.       Expected Outcomes Short Term: Able to explain why pulse checking is important during independent exercise;Long Term: Able to check pulse independently and accurately       Understanding of Exercise Prescription Yes       Intervention Provide education, explanation, and written materials on patient's individual exercise prescription       Expected Outcomes Long Term: Able to explain home exercise prescription to exercise independently;Short Term: Able to explain program exercise prescription                Exercise Goals Re-Evaluation :  Exercise Goals Re-Evaluation     Row Name 09/23/21 1739 10/04/21 1420 10/20/21 0806 11/02/21 1544 11/15/21 1729     Exercise Goal Re-Evaluation   Exercise Goals Review Increase Physical Activity;Able to understand and use rate of perceived exertion (RPE) scale;Knowledge and understanding of Target Heart Rate Range (THRR);Understanding of Exercise Prescription;Increase Strength and Stamina;Able to check pulse independently Increase Physical Activity;Increase Strength and Stamina;Understanding of Exercise Prescription  Increase Physical Activity;Increase Strength and Stamina;Understanding of Exercise Prescription Increase Physical Activity;Increase Strength and Stamina;Understanding of Exercise Prescription Increase Physical Activity;Increase Strength and Stamina;Understanding of Exercise Prescription   Comments Reviewed RPE and dyspnea scales, THR and program prescription with pt today.  Pt voiced understanding and was given a copy of goals to take home. Valerya is off to a good start in rehab.  She has completed her first three full days of exercise thus far.  She is already up to 2 METs on the NuStep. We will continue monitor her progress. Lexis is doing well in rehab. She has consistently walked 20 hallway laps for around 10 min. She also has done well with level 1 on the XR. We will continue to monitor her progress in the program. Tanecia has not been here since last review. She has called out several times due to work as well as having problems with low BP. She was encouraged to call her doctor to let them know. We hope to see her attendance improve overtime. Sayla is doing well since returning to rehab. Her attendance has still been inconsistent but she was able to show up 4 times since the last review. She has continued to tolerate level 1 on the T4 and the XR.Marland Kitchen We will continue to monitor her progress in the program.   Expected Outcomes Short: Use RPE daily to regulate intensity. Long: Follow program prescription in THR. Short: Conitnue to attend rehab regularly Long: Continue to follow program prescription Short: Conitnue to attend rehab regularly Long: Continue to follow program prescription Short: Maintain good attendence Long: Complete [facility] Program Short: Maintain good attendence Long: Complete [place] Name 11/29/21 1510             Exercise Goal Re-Evaluation   Exercise Goals Review Increase Physical Activity;Increase Strength and Stamina;Understanding of Exercise Prescription        Comments Angie's attendance with rehab is very sporadic. Some sessions are missed and she sometimes does not get a full session in due to her work schedule leaving late. Staff discussed with her the attendance policy and how she would not benefit from her current attendance. Patient spoke with her boss who is letting her leave early so she can attend reahb in full. She was offered to come 2x/week, however, patient opted to stay with 3 and stated she will be more compliant with attendance. Last session she was  here she walked 18 laps in the hallway. We will continue to monitor and hope to see more progression with better attendance.       Expected Outcomes Short: Increase level on T4 and XR Long: Increase overall MET level                Discharge Exercise Prescription (Final Exercise Prescription Changes):  Exercise Prescription Changes - 11/29/21 1500       Response to Exercise   Blood Pressure (Admit) 102/54    Blood Pressure (Exit) 96/58    Heart Rate (Admit) 78 bpm    Heart Rate (Exercise) 106 bpm    Heart Rate (Exit) 76 bpm    Rating of Perceived Exertion (Exercise) 12    Symptoms none    Duration Continue with 30 min of aerobic exercise without signs/symptoms of physical distress.    Intensity THRR unchanged      Progression   Progression Continue to progress workloads to maintain intensity without signs/symptoms of physical distress.    Average METs 1.88      Resistance Training   Training Prescription Yes    Weight 2 lb    Reps 10-15      Interval Training   Interval Training No      REL-XR   Level 1    Minutes 15    METs 2      Track   Laps 14   18 in the hallway   Minutes 15    METs 1.76      Oxygen   Maintain Oxygen Saturation 88% or higher             Nutrition:  Target Goals: Understanding of nutrition guidelines, daily intake of sodium <1575m, cholesterol <2066m calories 30% from fat and 7% or less from saturated fats, daily to have 5 or more  servings of fruits and vegetables.  Education: All About Nutrition: -Group instruction provided by verbal, written material, interactive activities, discussions, models, and posters to present general guidelines for heart healthy nutrition including fat, fiber, MyPlate, the role of sodium in heart healthy nutrition, utilization of the nutrition label, and utilization of this knowledge for meal planning. Follow up email sent as well. Written material given at graduation.   Biometrics:  Pre Biometrics - 09/21/21 1038       Pre Biometrics   Height _0  (1.651 m)    Weight 101 lb 11.2 oz (46.1 kg)    BMI (Calculated) 16.92    Single Leg Stand 3.8 seconds              Nutrition Therapy Plan and Nutrition Goals:  Nutrition Therapy & Goals - 12/02/21 1302       Nutrition Therapy   Diet High calorie, high protein    Protein (specify units) 80-90g    Fiber 21 grams    Whole Grain Foods 1 servings   Pt has low appetite   Saturated Fats 17 max. grams   meeting protein/calorie needs more important   Fruits and Vegetables 5 servings/day   Pt has low appetite   Sodium 2 grams   meeting protein/calorie needs more important     Personal Nutrition Goals   Nutrition Goal ST: try nutritional shake, drink in between meals, add protein and fat to meals/snacks, eat more frequent meals LT: meet calorie and protein needs    Comments [age].o. F admitted to cardiac rehab for CHF. PMHx includes HTN, gout, afib, breast cancer s/p mastectomy and  adriamycin chemotherapy and non-ischemic cardiomyopathy (EF 20-25% with recovery to 40-45% now severely reduced 10-15%). Relevant medications includes vit D3, ferrous sulfate, folic acid, furosemide, hydrocodone, jardiance, imodium, mag-ox, K+, Vit B12. PYP Score: 52. Vegetables & Fruits 8/12. Breads, Grains & Cereals 6/12. Red & Processed Meat 5/12. Poultry 0/2. Fish & Shellfish 2/4. Beans, Nuts & Seeds 2/4. Milk & Dairy Foods 3/6. Toppings, Oils, Seasonings & Salt  9/20. Sweets, Snacks & Restaurant Food 7/14. Beverages 10/10.  She will not eat normally until 1pm and the last time she eats is about 8pm. B: OJ, jello cups or cereal (2% milk, pops) L: sandwich (white bread with mayo and deli meat like bolonga) S: jello pack or sliced apples with PB butter D: chicken salad on crossaint with lays potato chips. Drinks: water. Angie reports that her MD gave her a medication to help with appetite at the beginning of the year, but she no longer takes them as her appetite is back to normal. Angie would like to gain weight back. Discussed high calorie/high protein MNT; adding beans to grains, greek yogurt and butter to potatoes, oil to vegetables, peanut butter, etc.  Reviewed general heart healthy eating.      Intervention Plan   Intervention Prescribe, educate and counsel regarding individualized specific dietary modifications aiming towards targeted core components such as weight, hypertension, lipid management, diabetes, heart failure and other comorbidities.    Expected Outcomes Short Term Goal: Understand basic principles of dietary content, such as calories, fat, sodium, cholesterol and nutrients.;Long Term Goal: Adherence to prescribed nutrition plan.;Short Term Goal: A plan has been developed with personal nutrition goals set during dietitian appointment.             Nutrition Assessments:  MEDIFICTS Score Key: ?70 Need to make dietary changes  40-70 Heart Healthy Diet ? 40 Therapeutic Level Cholesterol Diet  Flowsheet Row Cardiac Rehab from 09/21/2021 in [facility] Cardiac and Pulmonary Rehab  Picture Your Plate Total Score on Admission 52      Picture Your Plate Scores: <56 Unhealthy dietary pattern with much room for improvement. 41-50 Dietary pattern unlikely to meet recommendations for good health and room for improvement. 51-60 More healthful dietary pattern, with some room for improvement.  >60 Healthy dietary pattern, although there may be some  specific behaviors that could be improved.    Nutrition Goals Re-Evaluation:  Nutrition Goals Re-Evaluation     [place] Name 11/04/21 1747 11/25/21 1753           Goals   Current Weight 98 lb (44.5 kg) 98 lb (44.5 kg)      Nutrition Goal Gain weight Meet with the dietitian      Comment Informed patient of some foods that could help her gain weight. Gave her an option of protien without dairy. (gave patient a printout of product) She is trying to eat more but needs some help intaking more calories. Patient would like to meet with the dietictan and try to gain weight.      Expected Outcome Short: try to drink protien shakes routinely. Long: gain weight. Short: meet with the dietitian.Long: eat a diet that adheres to her needs.               Nutrition Goals Discharge (Final Nutrition Goals Re-Evaluation):  Nutrition Goals Re-Evaluation - 11/25/21 1753       Goals   Current Weight 98 lb (44.5 kg)    Nutrition Goal Meet with the dietitian    Comment Patient would  like to meet with the dietictan and try to gain weight.    Expected Outcome Short: meet with the dietitian.Long: eat a diet that adheres to her needs.             Psychosocial: Target Goals: Acknowledge presence or absence of significant depression and/or stress, maximize coping skills, provide positive support system. Participant is able to verbalize types and ability to use techniques and skills needed for reducing stress and depression.   Education: Stress, Anxiety, and Depression - Group verbal and visual presentation to define topics covered.  Reviews how body is impacted by stress, anxiety, and depression.  Also discusses healthy ways to reduce stress and to treat/manage anxiety and depression.  Written material given at graduation.   Education: Sleep Hygiene -Provides group verbal and written instruction about how sleep can affect your health.  Define sleep hygiene, discuss sleep cycles and impact of sleep  habits. Review good sleep hygiene tips.    Initial Review & Psychosocial Screening:  Initial Psych Review & Screening - 09/15/21 1456       Initial Review   Current issues with Current Stress Concerns    Source of Stress Concerns Chronic Illness    Comments Tanya reports stress from trying to decide what her next steps are medically: transplant or LVAD. She relies on her husband and a couple of sister who live close to support her. She is still working and would like to work around her schedule to attend cardiac rehab; she would like to increase her strength and balance as well.  She reports no sleep issues at this time. She likes to shop, cooking, and going to the beach to help to relieve stress.      Family Dynamics   Good Support System? Yes   husband, sister     Barriers   Psychosocial barriers to participate in program The patient should benefit from training in stress management and relaxation.;Psychosocial barriers identified (see note)      Screening Interventions   Interventions Encouraged to exercise;Provide feedback about the scores to participant;To provide support and resources with identified psychosocial needs    Expected Outcomes Short Term goal: Utilizing psychosocial counselor, staff and physician to assist with identification of specific Stressors or current issues interfering with healing process. Setting desired goal for each stressor or current issue identified.;Long Term Goal: Stressors or current issues are controlled or eliminated.;Short Term goal: Identification and review with participant of any Quality of Life or Depression concerns found by scoring the questionnaire.;Long Term goal: The participant improves quality of Life and PHQ9 Scores as seen by post scores and/or verbalization of changes             Quality of Life Scores:   Quality of Life - 09/21/21 1344       Quality of Life   Select Quality of Life      Quality of Life Scores    Health/Function Pre 26.6 %    Socioeconomic Pre 28.75 %    Psych/Spiritual Pre 29.14 %    Family Pre 26.8 %    GLOBAL Pre 27.63 %            Scores of 19 and below usually indicate a poorer quality of life in these areas.  A difference of  2-3 points is a clinically meaningful difference.  A difference of 2-3 points in the total score of the Quality of Life Index has been associated with significant improvement in overall quality of life,  self-image, physical symptoms, and general health in studies assessing change in quality of life.  PHQ-9: Review Flowsheet       09/21/2021 12/07/2017  Depression screen PHQ 2/9  Decreased Interest 0 0  Down, Depressed, Hopeless 0 0  PHQ - 2 Score 0 0  Altered sleeping 0 0  Tired, decreased energy 0 0  Change in appetite 0 0  Feeling bad or failure about yourself  0 0  Trouble concentrating 0 0  Moving slowly or fidgety/restless 0 0  Suicidal thoughts 0 0  PHQ-9 Score 0 0  Difficult doing work/chores Not difficult at all -   Interpretation of Total Score  Total Score Depression Severity:  1-4 = Minimal depression, 5-9 = Mild depression, 10-14 = Moderate depression, 15-19 = Moderately severe depression, 20-27 = Severe depression   Psychosocial Evaluation and Intervention:  Psychosocial Evaluation - 09/15/21 1502       Psychosocial Evaluation & Interventions   Interventions Stress management education;Relaxation education;Encouraged to exercise with the program and follow exercise prescription    Comments Deborrah reports stress from trying to decide what her next steps are medically: transplant or LVAD. She relies on her husband and a couple of sister who live close to support her. She is still working and would like to work around her schedule to attend cardiac rehab; she would like to increase her strength and balance as well.  She reports no sleep issues at this time. She likes to shop, cooking, and going to the beach to help to relieve  stress.    Expected Outcomes ST: attend all scheduled cardiac rehab exercise/education sessions, progess with exercise prescription LT: improve strength and progress with exercise prescription independently    Continue Psychosocial Services  Follow up required by staff             Psychosocial Re-Evaluation:  Psychosocial Re-Evaluation     [place] Name 11/04/21 1748 11/25/21 1751           Psychosocial Re-Evaluation   Current issues with None Identified None Identified      Comments Patient reports no issues with their current mental states, sleep, stress, depression or anxiety. Will follow up with patient in a few weeks for any changes. Aitanna states that she has been thinking about being more healthy. She states that she is more determined to workout and get stronger. She has gone to the beach last week and had a relaxing time with her husband.      Expected Outcomes Short: Continue to exercise regularly to support mental health and notify staff of any changes. Long: maintain mental health and well being through teaching of rehab or prescribed medications independently. Short: attend hearttrack to keep stress at a minimum. Long: maintain exercise and reduce stressors independently.      Interventions Encouraged to attend Cardiac Rehabilitation for the exercise Encouraged to attend Cardiac Rehabilitation for the exercise      Continue Psychosocial Services  Follow up required by staff Follow up required by staff               Psychosocial Discharge (Final Psychosocial Re-Evaluation):  Psychosocial Re-Evaluation - 11/25/21 1751       Psychosocial Re-Evaluation   Current issues with None Identified    Comments Ramatoulaye states that she has been thinking about being more healthy. She states that she is more determined to workout and get stronger. She has gone to the beach last week and had a relaxing time with her husband.  Expected Outcomes Short: attend hearttrack to keep stress at a  minimum. Long: maintain exercise and reduce stressors independently.    Interventions Encouraged to attend Cardiac Rehabilitation for the exercise    Continue Psychosocial Services  Follow up required by staff             Vocational Rehabilitation: Provide vocational rehab assistance to qualifying candidates.   Vocational Rehab Evaluation & Intervention:  Vocational Rehab - 09/15/21 1455       Initial Vocational Rehab Evaluation & Intervention   Assessment shows need for Vocational Rehabilitation No      Vocational Rehab Re-Evaulation   Comments She is still working.             Education: Education Goals: Education classes will be provided on a variety of topics geared toward better understanding of heart health and risk factor modification. Participant will state understanding/return demonstration of topics presented as noted by education test scores.  Learning Barriers/Preferences:  Learning Barriers/Preferences - 09/15/21 1451       Learning Barriers/Preferences   Learning Barriers None    Learning Preferences None             General Cardiac Education Topics:  AED/CPR: - Group verbal and written instruction with the use of models to demonstrate the basic use of the AED with the basic ABC's of resuscitation.   Anatomy and Cardiac Procedures: - Group verbal and visual presentation and models provide information about basic cardiac anatomy and function. Reviews the testing methods done to diagnose heart disease and the outcomes of the test results. Describes the treatment choices: Medical Management, Angioplasty, or Coronary Bypass Surgery for treating various heart conditions including Myocardial Infarction, Angina, Valve Disease, and Cardiac Arrhythmias.  Written material given at graduation.   Medication Safety: - Group verbal and visual instruction to review commonly prescribed medications for heart and lung disease. Reviews the medication, class of the  drug, and side effects. Includes the steps to properly store meds and maintain the prescription regimen.  Written material given at graduation.   Intimacy: - Group verbal instruction through game format to discuss how heart and lung disease can affect sexual intimacy. Written material given at graduation..   Know Your Numbers and Heart Failure: - Group verbal and visual instruction to discuss disease risk factors for cardiac and pulmonary disease and treatment options.  Reviews associated critical values for Overweight/Obesity, Hypertension, Cholesterol, and Diabetes.  Discusses basics of heart failure: signs/symptoms and treatments.  Introduces Heart Failure Zone chart for action plan for heart failure.  Written material given at graduation.   Infection Prevention: - Provides verbal and written material to individual with discussion of infection control including proper hand washing and proper equipment cleaning during exercise session. Flowsheet Row Cardiac Rehab from 09/21/2021 in [facility] Cardiac and Pulmonary Rehab  Education need identified 09/21/21  Date 09/21/21  Educator NT  Instruction Review Code 1- Verbalizes Understanding       Falls Prevention: - Provides verbal and written material to individual with discussion of falls prevention and safety. Flowsheet Row Cardiac Rehab from 09/21/2021 in [facility] Cardiac and Pulmonary Rehab  Education need identified 09/15/21  Date 09/15/21  Educator [place]  Instruction Review Code 1- Verbalizes Understanding       Other: -Provides group and verbal instruction on various topics (see comments)   Knowledge Questionnaire Score:  Knowledge Questionnaire Score - 09/21/21 1340       Knowledge Questionnaire Score   Pre Score 23/26  Core Components/Risk Factors/Patient Goals at Admission:  Personal Goals and Risk Factors at Admission - 09/21/21 1351       Core Components/Risk Factors/Patient Goals on Admission    Weight  Management Weight Gain;Yes    Intervention Weight Management: Develop a combined nutrition and exercise program designed to reach desired caloric intake, while maintaining appropriate intake of nutrient and fiber, sodium and fats, and appropriate energy expenditure required for the weight goal.;Weight Management: Provide education and appropriate resources to help participant work on and attain dietary goals.    Admit Weight 101 lb 11.2 oz (46.1 kg)    Goal Weight: Short Term 105 lb (47.6 kg)    Goal Weight: Long Term 110 lb (49.9 kg)    Expected Outcomes Short Term: Continue to assess and modify interventions until short term weight is achieved;Long Term: Adherence to nutrition and physical activity/exercise program aimed toward attainment of established weight goal;Weight Gain: Understanding of general recommendations for a high calorie, high protein meal plan that promotes weight gain by distributing calorie intake throughout the day with the consumption for 4-5 meals, snacks, and/or supplements;Understanding of distribution of calorie intake throughout the day with the consumption of 4-5 meals/snacks    Heart Failure Yes    Intervention Provide a combined exercise and nutrition program that is supplemented with education, support and counseling about heart failure. Directed toward relieving symptoms such as shortness of breath, decreased exercise tolerance, and extremity edema.    Expected Outcomes Short term: Attendance in program 2-3 days a week with increased exercise capacity. Reported lower sodium intake. Reported increased fruit and vegetable intake. Reports medication compliance.;Improve functional capacity of life;Short term: Daily weights obtained and reported for increase. Utilizing diuretic protocols set by physician.;Long term: Adoption of self-care skills and reduction of barriers for early signs and symptoms recognition and intervention leading to self-care maintenance.    Hypertension Yes     Intervention Provide education on lifestyle modifcations including regular physical activity/exercise, weight management, moderate sodium restriction and increased consumption of fresh fruit, vegetables, and low fat dairy, alcohol moderation, and smoking cessation.;Monitor prescription use compliance.    Expected Outcomes Short Term: Continued assessment and intervention until BP is < 140/72m HG in hypertensive participants. < 130/878mHG in hypertensive participants with diabetes, heart failure or chronic kidney disease.;Long Term: Maintenance of blood pressure at goal levels.             Education:Diabetes - Individual verbal and written instruction to review signs/symptoms of diabetes, desired ranges of glucose level fasting, after meals and with exercise. Acknowledge that pre and post exercise glucose checks will be done for 3 sessions at entry of program.   Core Components/Risk Factors/Patient Goals Review:   Goals and Risk Factor Review     Row Name 11/04/21 1737 11/25/21 1748           Core Components/Risk Factors/Patient Goals Review   Personal Goals Review Weight Management/Obesity Weight Management/Obesity      Review AnLeialohaants to gain weight and has since lost weight since the begining of the program. She is trying to eat more to gain weight. Recommended some protien powder and ways to intake more calories. AnBruceants to gain weight and has not gained weight in the last 3 weeks. Her appetite is hit or miss somedays. She states that she eating ice cream, cookies and candy. She knows she needs to eat more and a better diet.      Expected Outcomes Short: gain some weight. Long  Reach weight goal. Short: try to gain weight in the next few weeks. Long: gain more weight.               Core Components/Risk Factors/Patient Goals at Discharge (Final Review):   Goals and Risk Factor Review - 11/25/21 1748       Core Components/Risk Factors/Patient Goals Review    Personal Goals Review Weight Management/Obesity    Review Evangelynn wants to gain weight and has not gained weight in the last 3 weeks. Her appetite is hit or miss somedays. She states that she eating ice cream, cookies and candy. She knows she needs to eat more and a better diet.    Expected Outcomes Short: try to gain weight in the next few weeks. Long: gain more weight.             ITP Comments:  ITP Comments     Row Name 09/15/21 1511 09/21/21 1035 09/23/21 1738 10/13/21 1343 11/04/21 1003   ITP Comments Virtual orientation call completed today. She has an appointment on Date: 09/21/21  for EP eval and gym Orientation.  Documentation of diagnosis can be found in [facility] Date: 08/18/21 . Completed 6MWT and gym orientation. Initial ITP created and sent for review to Dr. Emily Filbert, Medical Director. First full day of exercise!  Patient was oriented to gym and equipment including functions, settings, policies, and procedures.  Patient's individual exercise prescription and treatment plan were reviewed.  All starting workloads were established based on the results of the 6 minute walk test done at initial orientation visit.  The plan for exercise progression was also introduced and progression will be customized based on patient's performance and goals. 30 Day review completed. Medical Director ITP review done, changes made as directed, and signed approval by Medical Director.   NEW Called pt to check in as she has only been to rehab 2x this past month; she reports she will be at rehab this afternoon.    [place] Name 11/10/21 0751 12/02/21 1400 12/08/21 1023       ITP Comments 30 Day review completed. Medical Director ITP review done, changes made as directed, and signed approval by Medical Director. Completed initial RD consultation 30 Day review completed. Medical Director ITP review done, changes made as directed, and signed approval by Medical Director.              Comments:

## 2021-12-09 ENCOUNTER — Encounter: Payer: BC Managed Care – PPO | Attending: Cardiovascular Disease | Admitting: *Deleted

## 2021-12-09 DIAGNOSIS — I5022 Chronic systolic (congestive) heart failure: Secondary | ICD-10-CM | POA: Insufficient documentation

## 2021-12-09 NOTE — Progress Notes (Signed)
Daily Session Note  Patient Details  Name: Dorothy Mann MRN: [id number] Date of Birth: 01/12/1956 Referring Provider:   Flowsheet Row Cardiac Rehab from 09/21/2021 in [facility] Cardiac and Pulmonary Rehab  Referring Provider [place]       Encounter Date: 12/09/2021  Check In:  Session Check In - 12/09/21 1731       Check-In   Supervising physician immediately available to respond to emergencies See telemetry face sheet for immediately available ER MD    Location [facility]-Cardiac & Pulmonary Rehab    Staff Present Coralie Keens, MS, ASCM CEP, Exercise Physiologist;Lavontay Kirk Sherryll Burger, RN BSN    Virtual Visit No    Medication changes reported     No    Fall or balance concerns reported    No    Warm-up and Cool-down Performed on first and last piece of equipment    Resistance Training Performed Yes    VAD Patient? No    PAD/SET Patient? No      Pain Assessment   Currently in Pain? No/denies                Social History   Tobacco Use  Smoking Status Never  Smokeless Tobacco Never    Goals Met:  Independence with exercise equipment Exercise tolerated well No report of concerns or symptoms today Strength training completed today  Goals Unmet:  Not Applicable  Comments: Pt able to follow exercise prescription today without complaint.  Will continue to monitor for progression.    Dr. Emily Filbert is Medical Director for [place].  Dr. Ottie Glazier is Medical Director for [facility] Pulmonary Rehabilitation.

## 2021-12-13 ENCOUNTER — Encounter: Payer: BC Managed Care – PPO | Admitting: *Deleted

## 2021-12-13 DIAGNOSIS — I5022 Chronic systolic (congestive) heart failure: Secondary | ICD-10-CM

## 2021-12-13 NOTE — Progress Notes (Signed)
Daily Session Note  Patient Details  Name: Dorothy Mann MRN: [id number] Date of Birth: [birth year] Referring Provider:   Flowsheet Row Cardiac Rehab from 09/21/2021 in [facility] Cardiac and Pulmonary Rehab  Referring Provider Byku       Encounter Date: 12/13/2021  Check In:  Session Check In - 12/13/21 1729       Check-In   Supervising physician immediately available to respond to emergencies See telemetry face sheet for immediately available ER MD    Location [facility]-Cardiac & Pulmonary Rehab    Staff Present Justin Mend, RCP,RRT,BSRT;Noah Tickle, BS, Exercise Physiologist;Abeni Finchum Sherryll Burger, RN BSN    Virtual Visit No    Medication changes reported     No    Fall or balance concerns reported    No    Warm-up and Cool-down Performed on first and last piece of equipment    Resistance Training Performed Yes    VAD Patient? No    PAD/SET Patient? No      Pain Assessment   Currently in Pain? No/denies                Social History   Tobacco Use  Smoking Status Never  Smokeless Tobacco Never    Goals Met:  Independence with exercise equipment Exercise tolerated well No report of concerns or symptoms today Strength training completed today  Goals Unmet:  Not Applicable  Comments: Pt able to follow exercise prescription today without complaint.  Will continue to monitor for progression.    Dr. Emily Filbert is Medical Director for [place].  Dr. Ottie Glazier is Medical Director for [facility] Pulmonary Rehabilitation.

## 2021-12-15 ENCOUNTER — Encounter: Payer: BC Managed Care – PPO | Admitting: *Deleted

## 2021-12-15 DIAGNOSIS — I5022 Chronic systolic (congestive) heart failure: Secondary | ICD-10-CM

## 2021-12-15 NOTE — Progress Notes (Signed)
Daily Session Note  Patient Details  Name: Dorothy Mann MRN: [id number] Date of Birth: [birth year] Referring Provider:   Flowsheet Row Cardiac Rehab from 09/21/2021 in [facility] Cardiac and Pulmonary Rehab  Referring Provider Byku       Encounter Date: 12/15/2021  Check In:  Session Check In - 12/15/21 1737       Check-In   Supervising physician immediately available to respond to emergencies See telemetry face sheet for immediately available ER MD    Location [facility]-Cardiac & Pulmonary Rehab    Staff Present Earlean Shawl, BS, ACSM CEP, Exercise Physiologist;Zuri Bradway Sherryll Burger, RN BSN;Joseph Tessie Fass, RCP,RRT,BSRT    Virtual Visit No    Medication changes reported     No    Fall or balance concerns reported    No    Warm-up and Cool-down Performed on first and last piece of equipment    Resistance Training Performed Yes    VAD Patient? No    PAD/SET Patient? No      Pain Assessment   Currently in Pain? No/denies                Social History   Tobacco Use  Smoking Status Never  Smokeless Tobacco Never    Goals Met:  Independence with exercise equipment Exercise tolerated well No report of concerns or symptoms today Strength training completed today  Goals Unmet:  Not Applicable  Comments: Pt able to follow exercise prescription today without complaint.  Will continue to monitor for progression.    Dr. Emily Filbert is Medical Director for [place].  Dr. Ottie Glazier is Medical Director for [facility] Pulmonary Rehabilitation.

## 2021-12-20 ENCOUNTER — Encounter: Payer: BC Managed Care – PPO | Admitting: *Deleted

## 2021-12-20 DIAGNOSIS — I5022 Chronic systolic (congestive) heart failure: Secondary | ICD-10-CM | POA: Diagnosis not present

## 2021-12-20 NOTE — Progress Notes (Signed)
Daily Session Note  Patient Details  Name: Dorothy Mann MRN: [id number] Date of Birth: 03-17-1955 Referring Provider:   Flowsheet Row Cardiac Rehab from 09/21/2021 in [facility] Cardiac and Pulmonary Rehab  Referring Provider Byku       Encounter Date: 12/20/2021  Check In:  Session Check In - 12/20/21 1726       Check-In   Supervising physician immediately available to respond to emergencies See telemetry face sheet for immediately available ER MD    Staff Present Antionette Fairy, BS, Exercise Physiologist;Janine Reller Tamala Julian, RN, ADN    Virtual Visit No    Medication changes reported     No    Fall or balance concerns reported    No    Warm-up and Cool-down Performed on first and last piece of equipment    Resistance Training Performed Yes    VAD Patient? No    PAD/SET Patient? No      Pain Assessment   Currently in Pain? No/denies                Social History   Tobacco Use  Smoking Status Never  Smokeless Tobacco Never    Goals Met:  Independence with exercise equipment Exercise tolerated well No report of concerns or symptoms today Strength training completed today  Goals Unmet:  Not Applicable  Comments: Pt able to follow exercise prescription today without complaint.  Will continue to monitor for progression.    Dr. Emily Filbert is Medical Director for [place].  Dr. Ottie Glazier is Medical Director for [facility] Pulmonary Rehabilitation.

## 2021-12-22 ENCOUNTER — Encounter: Payer: BC Managed Care – PPO | Admitting: *Deleted

## 2021-12-22 DIAGNOSIS — I5022 Chronic systolic (congestive) heart failure: Secondary | ICD-10-CM | POA: Diagnosis not present

## 2021-12-22 NOTE — Progress Notes (Signed)
Daily Session Note  Patient Details  Name: Dorothy Mann MRN: [id number] Date of Birth: 1955-06-16 Referring Provider:   Flowsheet Row Cardiac Rehab from 09/21/2021 in [facility] Cardiac and Pulmonary Rehab  Referring Provider Byku       Encounter Date: 12/22/2021  Check In:  Session Check In - 12/22/21 1750       Check-In   Supervising physician immediately available to respond to emergencies See telemetry face sheet for immediately available ER MD    Location [facility]-Cardiac & Pulmonary Rehab    Staff Present Justin Mend, RCP,RRT,BSRT;Rimas Gilham Sherryll Burger, RN Moises Blood, BS, ACSM CEP, Exercise Physiologist    Virtual Visit No    Medication changes reported     No    Fall or balance concerns reported    No    Warm-up and Cool-down Performed on first and last piece of equipment    Resistance Training Performed Yes    VAD Patient? No    PAD/SET Patient? No      Pain Assessment   Currently in Pain? No/denies                Social History   Tobacco Use  Smoking Status Never  Smokeless Tobacco Never    Goals Met:  Independence with exercise equipment Exercise tolerated well No report of concerns or symptoms today Strength training completed today  Goals Unmet:  Not Applicable  Comments: Pt able to follow exercise prescription today without complaint.  Will continue to monitor for progression.    Dr. Emily Filbert is Medical Director for [facility].  Dr. Ottie Glazier is Medical Director for [facility] Pulmonary Rehabilitation.

## 2022-01-05 ENCOUNTER — Encounter: Payer: Self-pay | Admitting: *Deleted

## 2022-01-05 DIAGNOSIS — I5022 Chronic systolic (congestive) heart failure: Secondary | ICD-10-CM

## 2022-01-05 NOTE — Progress Notes (Signed)
Cardiac Individual Treatment Plan  Patient Details  Name: Roseana Rhine MRN: [id number] Date of Birth: [birth year] Referring Provider:   Flowsheet Row Cardiac Rehab from 09/21/2021 in [facility] Cardiac and Pulmonary Rehab  Referring Provider Byku       Initial Encounter Date:  Flowsheet Row Cardiac Rehab from 09/21/2021 in [facility] Cardiac and Pulmonary Rehab  Date 09/21/21       Visit Diagnosis: Chronic systolic HF (heart failure) ([place])  Patient's Home Medications on Admission:  Current Outpatient Medications:    alendronate (FOSAMAX) 70 MG tablet, Take by mouth., Disp: , Rfl:    allopurinol (ZYLOPRIM) 100 MG tablet, Take 100 mg by mouth daily., Disp: , Rfl:    Cholecalciferol 25 MCG (1000 UT) tablet, Take by mouth., Disp: , Rfl:    colchicine 0.6 MG tablet, Take 0.6 mg by mouth daily., Disp: , Rfl:    ferrous sulfate 325 (65 FE) MG tablet, Take by mouth. (Patient not taking: Reported on 09/15/2021), Disp: , Rfl:    folic acid (FOLVITE) 539 MCG tablet, Take by mouth., Disp: , Rfl:    furosemide (LASIX) 20 MG tablet, 40 mg., Disp: , Rfl:    gabapentin (NEURONTIN) 100 MG capsule, Take by mouth., Disp: , Rfl:    gabapentin (NEURONTIN) 100 MG capsule, as needed., Disp: , Rfl:    HYDROcodone-acetaminophen (NORCO/VICODIN) 5-325 MG tablet, Take 1-2 tablets by mouth every 6 (six) hours as needed. (Patient not taking: Reported on 09/15/2021), Disp: 21 tablet, Rfl: 0   JARDIANCE 10 MG TABS tablet, Take 10 mg by mouth daily., Disp: , Rfl:    loperamide (IMODIUM) 2 MG capsule, Take by mouth as needed., Disp: , Rfl:    magnesium oxide (MAG-OX) 400 MG tablet, Take 800 mg by mouth., Disp: , Rfl:    metoprolol succinate (TOPROL-XL) 50 MG 24 hr tablet, Take by mouth., Disp: , Rfl:    potassium chloride SA (KLOR-CON M) 20 MEQ tablet, Take 40 mEq by mouth daily. (Patient not taking: Reported on 09/15/2021), Disp: , Rfl:    sacubitril-valsartan (ENTRESTO) 24-26 MG, Take 1 tablet by mouth 2 (two) times daily., Disp:  , Rfl:    spironolactone (ALDACTONE) 25 MG tablet, Take by mouth., Disp: , Rfl:    spironolactone (ALDACTONE) 25 MG tablet, Take 0.5 tablets by mouth daily., Disp: , Rfl:    vitamin B-12 (CYANOCOBALAMIN) 100 MCG tablet, Take by mouth., Disp: , Rfl:   Past Medical History: Past Medical History:  Diagnosis Date   Breast cancer ([place]) 1997   left breast   Cancer ([place])    breast cancer   Hypertension    Personal history of chemotherapy    Personal history of radiation therapy     Tobacco Use: Social History   Tobacco Use  Smoking Status Never  Smokeless Tobacco Never    Labs: Review Flowsheet        No data to display           Exercise Target Goals: Exercise Program Goal: Individual exercise prescription set using results from initial 6 min walk test and THRR while considering  patient's activity barriers and safety.   Exercise Prescription Goal: Initial exercise prescription builds to 30-45 minutes a day of aerobic activity, 2-3 days per week.  Home exercise guidelines will be given to patient during program as part of exercise prescription that the participant will acknowledge.   Education: Aerobic Exercise: - Group verbal and visual presentation on the components of exercise prescription. Introduces F.I.T.T principle from ACSM  for exercise prescriptions.  Reviews F.I.T.T. principles of aerobic exercise including progression. Written material given at graduation. Flowsheet Row Cardiac Rehab from 09/21/2021 in [facility] Cardiac and Pulmonary Rehab  Education need identified 09/21/21       Education: Resistance Exercise: - Group verbal and visual presentation on the components of exercise prescription. Introduces F.I.T.T principle from ACSM for exercise prescriptions  Reviews F.I.T.T. principles of resistance exercise including progression. Written material given at graduation.    Education: Exercise & Equipment Safety: - Individual verbal instruction and demonstration  of equipment use and safety with use of the equipment. Flowsheet Row Cardiac Rehab from 09/21/2021 in [facility] Cardiac and Pulmonary Rehab  Education need identified 09/21/21  Date 09/21/21  Educator NT  Instruction Review Code 1- Verbalizes Understanding       Education: Exercise Physiology & General Exercise Guidelines: - Group verbal and written instruction with models to review the exercise physiology of the cardiovascular system and associated critical values. Provides general exercise guidelines with specific guidelines to those with heart or lung disease.  Flowsheet Row Cardiac Rehab from 09/21/2021 in [facility] Cardiac [facility] Rehab  Education need identified 09/21/21       Education: Flexibility, Balance, Mind/Body Relaxation: - Group verbal and visual presentation with interactive activity on the components of exercise prescription. Introduces F.I.T.T principle from ACSM for exercise prescriptions. Reviews F.I.T.T. principles of flexibility and balance exercise training including progression. Also discusses the mind body connection.  Reviews various relaxation techniques to help reduce and manage stress (i.e. Deep breathing, progressive muscle relaxation, and visualization). Balance handout provided to take home. Written material given at graduation.   Activity Barriers & Risk Stratification:  Activity Barriers & Cardiac Risk Stratification - 09/21/21 1039       Activity Barriers & Cardiac Risk Stratification   Activity Barriers Joint Problems;Balance Concerns;Muscular Weakness;Deconditioning    Cardiac Risk Stratification High             6 Minute Walk:  6 Minute Walk     Row Name 09/21/21 1036         6 Minute Walk   Phase Initial     Distance 700 feet     Walk Time 5.83 minutes     # of Rest Breaks 1     MPH 1.36     METS 2.39     RPE 11     Perceived Dyspnea  0     VO2 Peak 8.36     Symptoms No     Resting HR 76 bpm     Resting BP 84/58     Resting  Oxygen Saturation  100 %     Exercise Oxygen Saturation  during 6 min walk 100 %     Max Ex. HR 90 bpm     Max Ex. BP 96/60     2 Minute Post BP 82/54              Oxygen Initial Assessment:   Oxygen Re-Evaluation:   Oxygen Discharge (Final Oxygen Re-Evaluation):   Initial Exercise Prescription:  Initial Exercise Prescription - 09/21/21 1100       Date of Initial Exercise RX and Referring Provider   Date 09/21/21    Referring Provider Byku      Oxygen   Maintain Oxygen Saturation 88% or higher      Treadmill   MPH 1.3    Grade 0    Minutes 15    METs 2  NuStep   Level 1    SPM 80    Minutes 15    METs 2.39      REL-XR   Level 1    Minutes 15    METs 2.39      Biostep-RELP   Level 1    SPM 60    Minutes 15    METs 2.39      Track   Laps 20    Minutes 15    METs 2.09      Prescription Details   Frequency (times per week) 3    Duration Progress to 30 minutes of continuous aerobic without signs/symptoms of physical distress      Intensity   THRR 40-80% of Max Heartrate 108-140    Ratings of Perceived Exertion 11-15    Perceived Dyspnea 0-4      Progression   Progression Continue to progress workloads to maintain intensity without signs/symptoms of physical distress.      Resistance Training   Training Prescription Yes    Weight 2 lb    Reps 10-15             Perform Capillary Blood Glucose checks as needed.  Exercise Prescription Changes:   Exercise Prescription Changes     Row Name 09/21/21 1100 10/04/21 1400 10/20/21 0800 11/15/21 1700 11/29/21 1500     Response to Exercise   Blood Pressure (Admit) 84/58 108/64 104/64 98/56 102/54   Blood Pressure (Exercise) 96/60 102/56 112/60 -- --   Blood Pressure (Exit) 82/54 100/60 1_0   Heart Rate (Admit) 76 bpm 76 bpm 68 bpm 68 bpm 78 bpm   Heart Rate (Exercise) 90 bpm 93 bpm 97 bpm 104 bpm 106 bpm   Heart Rate (Exit) 74 bpm 81 bpm 75 bpm 88 bpm 76 bpm   Oxygen  Saturation (Admit) 100 % -- -- -- --   Oxygen Saturation (Exercise) 100 % -- -- -- --   Oxygen Saturation (Exit) 100 % -- -- -- --   Rating of Perceived Exertion (Exercise) _1 Perceived Dyspnea (Exercise) 0 -- -- -- --   Symptoms _2    Comments 6MWT results third full day of exercise -- -- --   Duration Progress to 30 minutes of  aerobic without signs/symptoms of physical distress Progress to 30 minutes of  aerobic without signs/symptoms of physical distress Progress to 30 minutes of  aerobic without signs/symptoms of physical distress Progress to 30 minutes of  aerobic without signs/symptoms of physical distress Continue with 30 min of aerobic exercise without signs/symptoms of physical distress.   Intensity _3      Progression   Progression -- Continue to progress workloads to maintain intensity without signs/symptoms of physical distress. Continue to progress workloads to maintain intensity without signs/symptoms of physical distress. Continue to progress workloads to maintain intensity without signs/symptoms of physical distress. Continue to progress workloads to maintain intensity without signs/symptoms of physical distress.   Average METs -- 1.6 2.4 1.83 1.88     Resistance Training   Training Prescription -- Yes Yes Yes Yes   Weight -- 2 lb 2 lb 2 lb 2 lb   Reps -- 10-15 10-15 10-15 10-15     Interval Training   Interval Training -- No No No No     NuStep   Level -- 1 -- 1 --   Minutes -- 15 --  15 --   METs -- 2 -- 2 --     REL-XR   Level -- _0 Minutes -- _1 METs -- 1.1 3 1.5 2     Track   Laps -- 20 20 -- 14  18 in the hallway   Minutes -- 15 15 -- 15   METs -- 1.8 1.8 -- 1.76     Oxygen   Maintain Oxygen Saturation -- 88% or higher 88% or higher 88% or higher 88% or higher    Row Name 12/13/21 1400 12/16/21 1400 12/27/21 1400         Response  to Exercise   Blood Pressure (Admit) 112/58 -- 112/62     Blood Pressure (Exit) 98/58 -- 96/58     Heart Rate (Admit) 72 bpm -- 79 bpm     Heart Rate (Exercise) 119 bpm -- 103 bpm     Heart Rate (Exit) 89 bpm -- 80 bpm     Rating of Perceived Exertion (Exercise) 13 -- 13     Symptoms none -- none     Duration Continue with 30 min of aerobic exercise without signs/symptoms of physical distress. -- Continue with 30 min of aerobic exercise without signs/symptoms of physical distress.     Intensity THRR unchanged -- THRR unchanged       Progression   Progression Continue to progress workloads to maintain intensity without signs/symptoms of physical distress. -- Continue to progress workloads to maintain intensity without signs/symptoms of physical distress.     Average METs 1.94 -- 2.29       Resistance Training   Training Prescription Yes -- Yes     Weight 2 lb -- 2 lb     Reps 10-15 -- 10-15       Interval Training   Interval Training No -- No       NuStep   Level 2 -- 2     Minutes 15 -- 15     METs 2 -- 3       REL-XR   Level 1 -- 1     Minutes 15 -- 15     METs -- -- 3       Track   Laps 20  Hallway -- 22  hallway = 18 laps regular track     Minutes 15 -- 15     METs 1.87 -- 1.98       Home Exercise Plan   Plans to continue exercise at -- Home (comment)  walking, stretching Home (comment)  walking, stretching     Frequency -- Add 2 additional days to program exercise sessions. Add 2 additional days to program exercise sessions.     Initial Home Exercises Provided -- 12/16/21 12/16/21       Oxygen   Maintain Oxygen Saturation 88% or higher 88% or higher 88% or higher              Exercise Comments:   Exercise Goals and Review:   Exercise Goals     Row Name 09/21/21 1336             Exercise Goals   Increase Physical Activity Yes       Intervention Provide advice, education, support and counseling about physical activity/exercise needs.;Develop an  individualized exercise prescription for aerobic and resistive training based on initial evaluation findings, risk stratification, comorbidities and participant's personal goals.  Expected Outcomes Short Term: Attend rehab on a regular basis to increase amount of physical activity.;Long Term: Add in home exercise to make exercise part of routine and to increase amount of physical activity.;Long Term: Exercising regularly at least 3-5 days a week.       Increase Strength and Stamina Yes       Intervention Provide advice, education, support and counseling about physical activity/exercise needs.;Develop an individualized exercise prescription for aerobic and resistive training based on initial evaluation findings, risk stratification, comorbidities and participant's personal goals.       Expected Outcomes Short Term: Increase workloads from initial exercise prescription for resistance, speed, and METs.;Short Term: Perform resistance training exercises routinely during rehab and add in resistance training at home;Long Term: Improve cardiorespiratory fitness, muscular endurance and strength as measured by increased METs and functional capacity (6MWT)       Able to understand and use rate of perceived exertion (RPE) scale Yes       Intervention Provide education and explanation on how to use RPE scale       Expected Outcomes Short Term: Able to use RPE daily in rehab to express subjective intensity level;Long Term:  Able to use RPE to guide intensity level when exercising independently       Able to understand and use Dyspnea scale Yes       Intervention Provide education and explanation on how to use Dyspnea scale       Expected Outcomes Short Term: Able to use Dyspnea scale daily in rehab to express subjective sense of shortness of breath during exertion;Long Term: Able to use Dyspnea scale to guide intensity level when exercising independently       Knowledge and understanding of Target Heart Rate Range  (THRR) Yes       Intervention Provide education and explanation of THRR including how the numbers were predicted and where they are located for reference       Expected Outcomes Short Term: Able to state/look up THRR;Short Term: Able to use daily as guideline for intensity in rehab;Long Term: Able to use THRR to govern intensity when exercising independently       Able to check pulse independently Yes       Intervention Review the importance of being able to check your own pulse for safety during independent exercise;Provide education and demonstration on how to check pulse in carotid and radial arteries.       Expected Outcomes Short Term: Able to explain why pulse checking is important during independent exercise;Long Term: Able to check pulse independently and accurately       Understanding of Exercise Prescription Yes       Intervention Provide education, explanation, and written materials on patient's individual exercise prescription       Expected Outcomes Long Term: Able to explain home exercise prescription to exercise independently;Short Term: Able to explain program exercise prescription                Exercise Goals Re-Evaluation :  Exercise Goals Re-Evaluation     Row Name 09/23/21 1739 10/04/21 1420 10/20/21 0806 11/02/21 1544 11/15/21 1729     Exercise Goal Re-Evaluation   Exercise Goals Review Increase Physical Activity;Able to understand and use rate of perceived exertion (RPE) scale;Knowledge and understanding of Target Heart Rate Range (THRR);Understanding of Exercise Prescription;Increase Strength and Stamina;Able to check pulse independently Increase Physical Activity;Increase Strength and Stamina;Understanding of Exercise Prescription Increase Physical Activity;Increase Strength and Stamina;Understanding of Exercise Prescription Increase  Physical Activity;Increase Strength and Stamina;Understanding of Exercise Prescription Increase Physical Activity;Increase Strength and  Stamina;Understanding of Exercise Prescription   Comments Reviewed RPE and dyspnea scales, THR and program prescription with pt today.  Pt voiced understanding and was given a copy of goals to take home. Danecia is off to a good start in rehab.  She has completed her first three full days of exercise thus far.  She is already up to 2 METs on the NuStep. We will continue monitor her progress. Dolora is doing well in rehab. She has consistently walked 20 hallway laps for around 10 min. She also has done well with level 1 on the XR. We will continue to monitor her progress in the program. Mystery has not been here since last review. She has called out several times due to work as well as having problems with low BP. She was encouraged to call her doctor to let them know. We hope to see her attendance improve overtime. Veneta is doing well since returning to rehab. Her attendance has still been inconsistent but she was able to show up 4 times since the last review. She has continued to tolerate level 1 on the T4 and the XR.Marland Kitchen We will continue to monitor her progress in the program.   Expected Outcomes Short: Use RPE daily to regulate intensity. Long: Follow program prescription in THR. Short: Conitnue to attend rehab regularly Long: Continue to follow program prescription Short: Conitnue to attend rehab regularly Long: Continue to follow program prescription Short: Maintain good attendence Long: Complete [facility] Program Short: Maintain good attendence Long: Complete [place] Name 11/29/21 1510 12/13/21 1413 12/16/21 1402 12/27/21 1413       Exercise Goal Re-Evaluation   Exercise Goals Review Increase Physical Activity;Increase Strength and Stamina;Understanding of Exercise Prescription Increase Physical Activity;Increase Strength and Stamina;Understanding of Exercise Prescription Understanding of Exercise Prescription;Knowledge and understanding of Target Heart Rate Range (THRR);Able to  understand and use rate of perceived exertion (RPE) scale;Increase Physical Activity;Increase Strength and Stamina;Able to understand and use Dyspnea scale;Able to check pulse independently Increase Physical Activity;Increase Strength and Stamina;Able to understand and use rate of perceived exertion (RPE) scale    Comments Angie's attendance with rehab is very sporadic. Some sessions are missed and she sometimes does not get a full session in due to her work schedule leaving late. Staff discussed with her the attendance policy and how she would not benefit from her current attendance. Patient spoke with her boss who is letting her leave early so she can attend reahb in full. She was offered to come 2x/week, however, patient opted to stay with 3 and stated she will be more compliant with attendance. Last session she was here she walked 18 laps in the hallway. We will continue to monitor and hope to see more progression with better attendance. Janace Hoard is doing well in rehab. Her attendance was much more consistent since the last evaluation. She also increased her overall MET level to 1.94 METs. She was able to walk up to 20 laps in the hallway also. We will continue to monitor her progress in the program. Reviewed home exercise with pt today.  Pt plans to walk and do strengthening/stretches for exercise.  Reviewed THR, pulse, RPE, sign and symptoms, pulse oximetery and when to call 911 or MD.  Also discussed weather considerations and indoor options.  Pt voiced understanding. Kimberlin has been better about being present on rehab days, however, she shows up 15 minutes late  each time and therefore constricts her time with the exercise as she usually only has time do 1 machine. Patient comes from work and has tried to get here earlier but has not been able to. She was able to walk 22 laps in the hallway, which is equivalent to 18 on the track. She would benefit from increasing her workload to level 2 on the XR. We tried to  increase her to 3 lb handweights, however, she was unable to perform the exercises appropriately as they were too heavy. Her 18 weeks will be up next month and staff already informed patient. Will continue to monitor.    Expected Outcomes Short: Increase level on T4 and XR Long: Increase overall MET level Short: Increase level on T4 and XR Long: Continue to increase strength and stamina. Short: Add on 1 day of exercise at home, check HR Long: Continue to exercise independently Short: Increase to level 2 on XR Long: Continue to increase overall MET level             Discharge Exercise Prescription (Final Exercise Prescription Changes):  Exercise Prescription Changes - 12/27/21 1400       Response to Exercise   Blood Pressure (Admit) 112/62    Blood Pressure (Exit) 96/58    Heart Rate (Admit) 79 bpm    Heart Rate (Exercise) 103 bpm    Heart Rate (Exit) 80 bpm    Rating of Perceived Exertion (Exercise) 13    Symptoms none    Duration Continue with 30 min of aerobic exercise without signs/symptoms of physical distress.    Intensity THRR unchanged      Progression   Progression Continue to progress workloads to maintain intensity without signs/symptoms of physical distress.    Average METs 2.29      Resistance Training   Training Prescription Yes    Weight 2 lb    Reps 10-15      Interval Training   Interval Training No      NuStep   Level 2    Minutes 15    METs 3      REL-XR   Level 1    Minutes 15    METs 3      Track   Laps 22   hallway = 18 laps regular track   Minutes 15    METs 1.98      Home Exercise Plan   Plans to continue exercise at Home (comment)   walking, stretching   Frequency Add 2 additional days to program exercise sessions.    Initial Home Exercises Provided 12/16/21      Oxygen   Maintain Oxygen Saturation 88% or higher             Nutrition:  Target Goals: Understanding of nutrition guidelines, daily intake of sodium <1539m,  cholesterol <201m calories 30% from fat and 7% or less from saturated fats, daily to have 5 or more servings of fruits and vegetables.  Education: All About Nutrition: -Group instruction provided by verbal, written material, interactive activities, discussions, models, and posters to present general guidelines for heart healthy nutrition including fat, fiber, MyPlate, the role of sodium in heart healthy nutrition, utilization of the nutrition label, and utilization of this knowledge for meal planning. Follow up email sent as well. Written material given at graduation.   Biometrics:  Pre Biometrics - 09/21/21 1038       Pre Biometrics   Height _0  (1.651 m)    Weight  101 lb 11.2 oz (46.1 kg)    BMI (Calculated) 16.92    Single Leg Stand 3.8 seconds              Nutrition Therapy Plan and Nutrition Goals:  Nutrition Therapy & Goals - 12/02/21 1302       Nutrition Therapy   Diet High calorie, high protein    Protein (specify units) 80-90g    Fiber 21 grams    Whole Grain Foods 1 servings   Pt has low appetite   Saturated Fats 17 max. grams   meeting protein/calorie needs more important   Fruits and Vegetables 5 servings/day   Pt has low appetite   Sodium 2 grams   meeting protein/calorie needs more important     Personal Nutrition Goals   Nutrition Goal ST: try nutritional shake, drink in between meals, add protein and fat to meals/snacks, eat more frequent meals LT: meet calorie and protein needs    Comments 66 y.o. F admitted to cardiac rehab for CHF. PMHx includes HTN, gout, afib, breast cancer s/p mastectomy and adriamycin chemotherapy and non-ischemic cardiomyopathy (EF 20-25% with recovery to 40-45% now severely reduced 10-15%). Relevant medications includes vit D3, ferrous sulfate, folic acid, furosemide, hydrocodone, jardiance, imodium, mag-ox, K+, Vit B12. PYP Score: 52. Vegetables & Fruits 8/12. Breads, Grains & Cereals 6/12. Red & Processed Meat 5/12. Poultry 0/2.  Fish & Shellfish 2/4. Beans, Nuts & Seeds 2/4. Milk & Dairy Foods 3/6. Toppings, Oils, Seasonings & Salt 9/20. Sweets, Snacks & Restaurant Food 7/14. Beverages 10/10.  She will not eat normally until 1pm and the last time she eats is about 8pm. B: OJ, jello cups or cereal (2% milk, pops) L: sandwich (white bread with mayo and deli meat like bolonga) S: jello pack or sliced apples with PB butter D: chicken salad on crossaint with lays potato chips. Drinks: water. Angie reports that her MD gave her a medication to help with appetite at the beginning of the year, but she no longer takes them as her appetite is back to normal. Angie would like to gain weight back. Discussed high calorie/high protein MNT; adding beans to grains, greek yogurt and butter to potatoes, oil to vegetables, peanut butter, etc.  Reviewed general heart healthy eating.      Intervention Plan   Intervention Prescribe, educate and counsel regarding individualized specific dietary modifications aiming towards targeted core components such as weight, hypertension, lipid management, diabetes, heart failure and other comorbidities.    Expected Outcomes Short Term Goal: Understand basic principles of dietary content, such as calories, fat, sodium, cholesterol and nutrients.;Long Term Goal: Adherence to prescribed nutrition plan.;Short Term Goal: A plan has been developed with personal nutrition goals set during dietitian appointment.             Nutrition Assessments:  MEDIFICTS Score Key: ?70 Need to make dietary changes  40-70 Heart Healthy Diet ? 40 Therapeutic Level Cholesterol Diet  Flowsheet Row Cardiac Rehab from 09/21/2021 in [facility] and [facility] Re[facility]  Picture Your Plate Total Score on Admission 52      Picture Your Plate Scores: <09 Unhealthy dietary pattern with much room for improvement. 41-50 Dietary pattern unlikely to meet recommendations for good health and room for improvement. 51-60 More healthful dietary  pattern, with some room for improvement.  >60 Healthy dietary pattern, although there may be some specific behaviors that could be improved.    Nutrition Goals Re-Evaluation:  Nutrition Goals Re-Evaluation     Row  Name 11/04/21 1747 11/25/21 1753 12/22/21 1739         Goals   Current Weight 98 lb (44.5 kg) 98 lb (44.5 kg) 100 lb (45.4 kg)     Nutrition Goal Gain weight Meet with the dietitian gain more weight     Comment Informed patient of some foods that could help her gain weight. Gave her an option of protien without dairy. (gave patient a printout of product) She is trying to eat more but needs some help intaking more calories. Patient would like to meet with the dietictan and try to gain weight. Angie has been able to put on a couple more pounds. She is drinking more protien shakes and states that her appetite has been better.     Expected Outcome Short: try to drink protien shakes routinely. Long: gain weight. Short: meet with the dietitian.Long: eat a diet that adheres to her needs. Short: gain a few more pounds in the next couple weeks. Long: maintain weight gain independently              Nutrition Goals Discharge (Final Nutrition Goals Re-Evaluation):  Nutrition Goals Re-Evaluation - 12/22/21 1739       Goals   Current Weight 100 lb (45.4 kg)    Nutrition Goal gain more weight    Comment Angie has been able to put on a couple more pounds. She is drinking more protien shakes and states that her appetite has been better.    Expected Outcome Short: gain a few more pounds in the next couple weeks. Long: maintain weight gain independently             Psychosocial: Target Goals: Acknowledge presence or absence of significant depression and/or stress, maximize coping skills, provide positive support system. Participant is able to verbalize types and ability to use techniques and skills needed for reducing stress and depression.   Education: Stress, Anxiety, and  Depression - Group verbal and visual presentation to define topics covered.  Reviews how body is impacted by stress, anxiety, and depression.  Also discusses healthy ways to reduce stress and to treat/manage anxiety and depression.  Written material given at graduation.   Education: Sleep Hygiene -Provides group verbal and written instruction about how sleep can affect your health.  Define sleep hygiene, discuss sleep cycles and impact of sleep habits. Review good sleep hygiene tips.    Initial Review & Psychosocial Screening:  Initial Psych Review & Screening - 09/15/21 1456       Initial Review   Current issues with Current Stress Concerns    Source of Stress Concerns Chronic Illness    Comments Teagyn reports stress from trying to decide what her next steps are medically: transplant or LVAD. She relies on her husband and a couple of sister who live close to support her. She is still working and would like to work around her schedule to attend cardiac rehab; she would like to increase her strength and balance as well.  She reports no sleep issues at this time. She likes to shop, cooking, and going to the beach to help to relieve stress.      Family Dynamics   Good Support System? Yes   husband, sister     Barriers   Psychosocial barriers to participate in program The patient should benefit from training in stress management and relaxation.;Psychosocial barriers identified (see note)      Screening Interventions   Interventions Encouraged to exercise;Provide feedback about the scores to participant;To  provide support and resources with identified psychosocial needs    Expected Outcomes Short Term goal: Utilizing psychosocial counselor, staff and physician to assist with identification of specific Stressors or current issues interfering with healing process. Setting desired goal for each stressor or current issue identified.;Long Term Goal: Stressors or current issues are controlled or  eliminated.;Short Term goal: Identification and review with participant of any Quality of Life or Depression concerns found by scoring the questionnaire.;Long Term goal: The participant improves quality of Life and PHQ9 Scores as seen by post scores and/or verbalization of changes             Quality of Life Scores:   Quality of Life - 09/21/21 1344       Quality of Life   Select Quality of Life      Quality of Life Scores   Health/Function Pre 26.6 %    Socioeconomic Pre 28.75 %    Psych/Spiritual Pre 29.14 %    Family Pre 26.8 %    GLOBAL Pre 27.63 %            Scores of 19 and below usually indicate a poorer quality of life in these areas.  A difference of  2-3 points is a clinically meaningful difference.  A difference of 2-3 points in the total score of the Quality of Life Index has been associated with significant improvement in overall quality of life, self-image, physical symptoms, and general health in studies assessing change in quality of life.  PHQ-9: Review Flowsheet       09/21/2021 12/07/2017  Depression screen PHQ 2/9  Decreased Interest 0 0  Down, Depressed, Hopeless 0 0  PHQ - 2 Score 0 0  Altered sleeping 0 0  Tired, decreased energy 0 0  Change in appetite 0 0  Feeling bad or failure about yourself  0 0  Trouble concentrating 0 0  Moving slowly or fidgety/restless 0 0  Suicidal thoughts 0 0  PHQ-9 Score 0 0  Difficult doing work/chores Not difficult at all -   Interpretation of Total Score  Total Score Depression Severity:  1-4 = Minimal depression, 5-9 = Mild depression, 10-14 = Moderate depression, 15-19 = Moderately severe depression, 20-27 = Severe depression   Psychosocial Evaluation and Intervention:  Psychosocial Evaluation - 09/15/21 1502       Psychosocial Evaluation & Interventions   Interventions Stress management education;Relaxation education;Encouraged to exercise with the program and follow exercise prescription    Comments  Lavene reports stress from trying to decide what her next steps are medically: transplant or LVAD. She relies on her husband and a couple of sister who live close to support her. She is still working and would like to work around her schedule to attend cardiac rehab; she would like to increase her strength and balance as well.  She reports no sleep issues at this time. She likes to shop, cooking, and going to the beach to help to relieve stress.    Expected Outcomes ST: attend all scheduled cardiac rehab exercise/education sessions, progess with exercise prescription LT: improve strength and progress with exercise prescription independently    Continue Psychosocial Services  Follow up required by staff             Psychosocial Re-Evaluation:  Psychosocial Re-Evaluation     [place] Name 11/04/21 1748 11/25/21 1751 12/22/21 1740         Psychosocial Re-Evaluation   Current issues with None Identified None Identified None Identified  Comments Patient reports no issues with their current mental states, sleep, stress, depression or anxiety. Will follow up with patient in a few weeks for any changes. Jacquilyn states that she has been thinking about being more healthy. She states that she is more determined to workout and get stronger. She has gone to the beach last week and had a relaxing time with her husband. Margerite states that she has had more energy since she has been able to eat more. She states no depresson or anxiety and does not take anything for her mood.     Expected Outcomes Short: Continue to exercise regularly to support mental health and notify staff of any changes. Long: maintain mental health and well being through teaching of rehab or prescribed medications independently. Short: attend [facility] to keep stress at a minimum. Long: maintain exercise and reduce stressors independently. Short: attend [facility] to keep stress at a minimum. Long: maintain exercise and reduce stressors  independently.     Interventions Encouraged to attend Cardiac Rehabilitation for the exercise Encouraged to attend Cardiac Rehabilitation for the exercise Encouraged to attend Cardiac Rehabilitation for the exercise     Continue Psychosocial Services  Follow up required by staff Follow up required by staff Follow up required by staff              Psychosocial Discharge (Final Psychosocial Re-Evaluation):  Psychosocial Re-Evaluation - 12/22/21 1740       Psychosocial Re-Evaluation   Current issues with None Identified    Comments Ana states that she has had more energy since she has been able to eat more. She states no depresson or anxiety and does not take anything for her mood.    Expected Outcomes Short: attend [facility]track to keep stress at a minimum. Long: maintain exercise and reduce stressors independently.    Interventions Encouraged to attend Cardiac Rehabilitation for the exercise    Continue Psychosocial Services  Follow up required by staff             Vocational Rehabilitation: Provide vocational rehab assistance to qualifying candidates.   Vocational Rehab Evaluation & Intervention:  Vocational Rehab - 09/15/21 1455       Initial Vocational Rehab Evaluation & Intervention   Assessment shows need for Vocational Rehabilitation No      Vocational Rehab Re-Evaulation   Comments She is still working.             Education: Education Goals: Education classes will be provided on a variety of topics geared toward better understanding of heart health and risk factor modification. Participant will state understanding/return demonstration of topics presented as noted by education test scores.  Learning Barriers/Preferences:  Learning Barriers/Preferences - 09/15/21 1451       Learning Barriers/Preferences   Learning Barriers None    Learning Preferences None             General Cardiac Education Topics:  AED/CPR: - Group verbal and written  instruction with the use of models to demonstrate the basic use of the AED with the basic ABC's of resuscitation.   Anatomy and Cardiac Procedures: - Group verbal and visual presentation and models provide information about basic cardiac anatomy and function. Reviews the testing methods done to diagnose heart disease and the outcomes of the test results. Describes the treatment choices: Medical Management, Angioplasty, or Coronary Bypass Surgery for treating various heart conditions including Myocardial Infarction, Angina, Valve Disease, and Cardiac Arrhythmias.  Written material given at graduation.   Medication Safety: -  Group verbal and visual instruction to review commonly prescribed medications for heart and lung disease. Reviews the medication, class of the drug, and side effects. Includes the steps to properly store meds and maintain the prescription regimen.  Written material given at graduation.   Intimacy: - Group verbal instruction through game format to discuss how heart and lung disease can affect sexual intimacy. Written material given at graduation..   Know Your Numbers and Heart Failure: - Group verbal and visual instruction to discuss disease risk factors for cardiac and pulmonary disease and treatment options.  Reviews associated critical values for Overweight/Obesity, Hypertension, Cholesterol, and Diabetes.  Discusses basics of heart failure: signs/symptoms and treatments.  Introduces Heart Failure Zone chart for action plan for heart failure.  Written material given at graduation.   Infection Prevention: - Provides verbal and written material to individual with discussion of infection control including proper hand washing and proper equipment cleaning during exercise session. Flowsheet Row Cardiac Rehab from 09/21/2021 in [facility] Cardiac and Pulmonary Rehab  Education need identified 09/21/21  Date 09/21/21  Educator NT  Instruction Review Code 1- Verbalizes Understanding        Falls Prevention: - Provides verbal and written material to individual with discussion of falls prevention and safety. Flowsheet Row Cardiac Rehab from 09/21/2021 in [facility] Cardiac and Pulmonary Rehab  Education need identified 09/15/21  Date 09/15/21  Educator [place]  Instruction Review Code 1- Verbalizes Understanding       Other: -Provides group and verbal instruction on various topics (see comments)   Knowledge Questionnaire Score:  Knowledge Questionnaire Score - 09/21/21 1340       Knowledge Questionnaire Score   Pre Score 23/26             Core Components/Risk Factors/Patient Goals at Admission:  Personal Goals and Risk Factors at Admission - 09/21/21 1351       Core Components/Risk Factors/Patient Goals on Admission    Weight Management Weight Gain;Yes    Intervention Weight Management: Develop a combined nutrition and exercise program designed to reach desired caloric intake, while maintaining appropriate intake of nutrient and fiber, sodium and fats, and appropriate energy expenditure required for the weight goal.;Weight Management: Provide education and appropriate resources to help participant work on and attain dietary goals.    Admit Weight 101 lb 11.2 oz (46.1 kg)    Goal Weight: Short Term 105 lb (47.6 kg)    Goal Weight: Long Term 110 lb (49.9 kg)    Expected Outcomes Short Term: Continue to assess and modify interventions until short term weight is achieved;Long Term: Adherence to nutrition and physical activity/exercise program aimed toward attainment of established weight goal;Weight Gain: Understanding of general recommendations for a high calorie, high protein meal plan that promotes weight gain by distributing calorie intake throughout the day with the consumption for 4-5 meals, snacks, and/or supplements;Understanding of distribution of calorie intake throughout the day with the consumption of 4-5 meals/snacks    Heart Failure Yes    Intervention  Provide a combined exercise and nutrition program that is supplemented with education, support and counseling about heart failure. Directed toward relieving symptoms such as shortness of breath, decreased exercise tolerance, and extremity edema.    Expected Outcomes Short term: Attendance in program 2-3 days a week with increased exercise capacity. Reported lower sodium intake. Reported increased fruit and vegetable intake. Reports medication compliance.;Improve functional capacity of life;Short term: Daily weights obtained and reported for increase. Utilizing diuretic protocols set by physician.;Long term: Adoption  of self-care skills and reduction of barriers for early signs and symptoms recognition and intervention leading to self-care maintenance.    Hypertension Yes    Intervention Provide education on lifestyle modifcations including regular physical activity/exercise, weight management, moderate sodium restriction and increased consumption of fresh fruit, vegetables, and low fat dairy, alcohol moderation, and smoking cessation.;Monitor prescription use compliance.    Expected Outcomes Short Term: Continued assessment and intervention until BP is < 140/21m HG in hypertensive participants. < 130/842mHG in hypertensive participants with diabetes, heart failure or chronic kidney disease.;Long Term: Maintenance of blood pressure at goal levels.             Education:Diabetes - Individual verbal and written instruction to review signs/symptoms of diabetes, desired ranges of glucose level fasting, after meals and with exercise. Acknowledge that pre and post exercise glucose checks will be done for 3 sessions at entry of program.   Core Components/Risk Factors/Patient Goals Review:   Goals and Risk Factor Review     Row Name 11/04/21 1737 11/25/21 1748 12/22/21 1733         Core Components/Risk Factors/Patient Goals Review   Personal Goals Review Weight Management/Obesity Weight  Management/Obesity Weight Management/Obesity     Review AnDariannaants to gain weight and has since lost weight since the begining of the program. She is trying to eat more to gain weight. Recommended some protien powder and ways to intake more calories. AnShantanaants to gain weight and has not gained weight in the last 3 weeks. Her appetite is hit or miss somedays. She states that she eating ice cream, cookies and candy. She knows she needs to eat more and a better diet. Angie has gone up two pounds in the last couple weeks. She has been trying to intake more calories to help her gain more weight.     Expected Outcomes Short: gain some weight. Long Reach weight goal. Short: try to gain weight in the next few weeks. Long: gain more weight. Short: try to gain another two pounds. Long: reach her weight goal.              Core Components/Risk Factors/Patient Goals at Discharge (Final Review):   Goals and Risk Factor Review - 12/22/21 1733       Core Components/Risk Factors/Patient Goals Review   Personal Goals Review Weight Management/Obesity    Review Angie has gone up two pounds in the last couple weeks. She has been trying to intake more calories to help her gain more weight.    Expected Outcomes Short: try to gain another two pounds. Long: reach her weight goal.             ITP Comments:  ITP Comments     Row Name 09/15/21 1511 09/21/21 1035 09/23/21 1738 10/13/21 1343 11/04/21 1003   ITP Comments Virtual orientation call completed today. She has an appointment on Date: 09/21/21  for EP eval and gym Orientation.  [facility] . Completed 6MWT and gym orientation. Initial ITP created and sent for review to Dr. MaEmily FilbertMedical Director. First full day of exercise!  Patient was oriented to gym and equipment including functions, settings, policies, and procedures.  Patient's individual exercise prescription and treatment plan were reviewed.   All starting workloads were established based on the results of the 6 minute walk test done at initial orientation visit.  The plan for exercise progression was also introduced and progression will be  customized based on patient's performance and goals. 30 Day review completed. Medical Director ITP review done, changes made as directed, and signed approval by Medical Director.   NEW Called pt to check in as she has only been to rehab 2x this past month; she reports she will be at rehab this afternoon.    Row Name 11/10/21 0751 12/02/21 1400 12/08/21 1023 01/05/22 0851     ITP Comments 30 Day review completed. Medical Director ITP review done, changes made as directed, and signed approval by Medical Director. Completed initial RD consultation 30 Day review completed. Medical Director ITP review done, changes made as directed, and signed approval by Medical Director. 30 Day review completed. Medical Director ITP review done, changes made as directed, and signed approval by Medical Director.             Comments:

## 2022-01-06 ENCOUNTER — Encounter: Payer: BC Managed Care – PPO | Admitting: *Deleted

## 2022-01-06 DIAGNOSIS — I5022 Chronic systolic (congestive) heart failure: Secondary | ICD-10-CM

## 2022-01-06 NOTE — Progress Notes (Signed)
Daily Session Note  Patient Details  Name: Dorothy Mann MRN: [id number] Date of Birth: Jan 15, 1956 Referring Provider:   Flowsheet Row Cardiac Rehab from 09/21/2021 in [facility] Re[facility]  Referring Provider Byku       Encounter Date: 01/06/2022  Check In:  Session Check In - 01/06/22 [place]       Check-In   Supervising physician immediately available to respond to emergencies See telemetry face sheet for immediately available ER MD    Location [facility]-Cardiac & Pulmonary Rehab    Staff Present Antionette Fairy, BS, Exercise Physiologist;Samary Shatz Sherryll Burger, RN Margurite Auerbach, MS, ASCM CEP, Exercise Physiologist    Virtual Visit No    Medication changes reported     No    Fall or balance concerns reported    No    Warm-up and Cool-down Performed on first and last piece of equipment    Resistance Training Performed Yes    VAD Patient? No    PAD/SET Patient? No      Pain Assessment   Currently in Pain? No/denies                Social History   Tobacco Use  Smoking Status Never  Smokeless Tobacco Never    Goals Met:  Independence with exercise equipment Exercise tolerated well No report of concerns or symptoms today Strength training completed today  Goals Unmet:  Not Applicable  Comments: Pt able to follow exercise prescription today without complaint.  Will continue to monitor for progression.    Dr. Emily Filbert is Medical Director for [place].  Dr. Ottie Glazier is Medical Director for [facility] Pulmonary Rehabilitation.

## 2022-01-10 ENCOUNTER — Encounter: Payer: BC Managed Care – PPO | Attending: Cardiovascular Disease | Admitting: *Deleted

## 2022-01-10 DIAGNOSIS — I5022 Chronic systolic (congestive) heart failure: Secondary | ICD-10-CM | POA: Insufficient documentation

## 2022-01-10 NOTE — Progress Notes (Signed)
Daily Session Note  Patient Details  Name: Dorothy Mann MRN: [id number] Date of Birth: [birth year] Referring Provider:   Flowsheet Row Cardiac Rehab from 09/21/2021 in [facility] Cardiac and Pulmonary Rehab  Referring Provider Byku       Encounter Date: 01/10/2022  Check In:  Session Check In - 01/10/22 1732       Check-In   Supervising physician immediately available to respond to emergencies See telemetry face sheet for immediately available ER MD    Location [facility]-Cardiac & Pulmonary Rehab    Staff Present Justin Mend, RCP,RRT,BSRT;Rocklyn Mayberry Sherryll Burger, RN BSN;Noah Tickle, BS, Exercise Physiologist    Virtual Visit No    Medication changes reported     No    Fall or balance concerns reported    No    Warm-up and Cool-down Performed on first and last piece of equipment    Resistance Training Performed Yes    VAD Patient? No    PAD/SET Patient? No      Pain Assessment   Currently in Pain? No/denies                Social History   Tobacco Use  Smoking Status Never  Smokeless Tobacco Never    Goals Met:  Independence with exercise equipment Exercise tolerated well No report of concerns or symptoms today Strength training completed today  Goals Unmet:  Not Applicable  Comments: Pt able to follow exercise prescription today without complaint.  Will continue to monitor for progression.    Dr. Emily Filbert is Medical Director for [place].  Dr. Ottie Glazier is Medical Director for [facility] Pulmonary Rehabilitation.

## 2022-01-12 ENCOUNTER — Encounter: Payer: BC Managed Care – PPO | Admitting: *Deleted

## 2022-01-12 DIAGNOSIS — I5022 Chronic systolic (congestive) heart failure: Secondary | ICD-10-CM

## 2022-01-12 NOTE — Progress Notes (Signed)
Daily Session Note  Patient Details  Name: Dorothy Mann MRN: [id number] Date of Birth: 05-07-1955 Referring Provider:   Flowsheet Row Cardiac Rehab from 09/21/2021 in [facility] Cardiac and Pulmonary Rehab  Referring Provider Byku       Encounter Date: 01/12/2022  Check In:  Session Check In - 01/12/22 [name]       Check-In   Supervising physician immediately available to respond to emergencies See telemetry face sheet for immediately available ER MD    Location [facility]-Cardiac & Pulmonary Rehab    Staff Present Justin Mend, RCP,RRT,BSRT;Kyrel Leighton Sherryll Burger, RN Moises Blood, BS, ACSM CEP, Exercise Physiologist    Virtual Visit No    Medication changes reported     No    Fall or balance concerns reported    No    Warm-up and Cool-down Performed on first and last piece of equipment    Resistance Training Performed Yes    VAD Patient? No    PAD/SET Patient? No      Pain Assessment   Currently in Pain? No/denies                Social History   Tobacco Use  Smoking Status Never  Smokeless Tobacco Never    Goals Met:  Independence with exercise equipment Exercise tolerated well No report of concerns or symptoms today Strength training completed today  Goals Unmet:  Not Applicable  Comments: Pt able to follow exercise prescription today without complaint.  Will continue to monitor for progression.    Dr. Emily Filbert is Medical Director for [place].  Dr. Ottie Glazier is Medical Director for [facility] Pulmonary Rehabilitation.

## 2022-01-17 ENCOUNTER — Encounter: Payer: BC Managed Care – PPO | Admitting: *Deleted

## 2022-01-17 DIAGNOSIS — I5022 Chronic systolic (congestive) heart failure: Secondary | ICD-10-CM

## 2022-01-17 NOTE — Progress Notes (Signed)
Daily Session Note  Patient Details  Name: Dorothy Mann MRN: [id number] Date of Birth: [birth year] Referring Provider:   Flowsheet Row Cardiac Rehab from 09/21/2021 in [facility] Cardiac and Pulmonary Rehab  Referring Provider Byku       Encounter Date: 01/17/2022  Check In:  Session Check In - 01/17/22 [place]       Check-In   Supervising physician immediately available to respond to emergencies See telemetry face sheet for immediately available ER MD    Location [facility]-Cardiac & Pulmonary Rehab    Staff Present Renita Papa, RN BSN;Noah Tickle, BS, Exercise Physiologist;Kelly Amedeo Plenty, BS, ACSM CEP, Exercise Physiologist    Virtual Visit No    Medication changes reported     No    Fall or balance concerns reported    No    Warm-up and Cool-down Performed on first and last piece of equipment    Resistance Training Performed Yes    VAD Patient? No    PAD/SET Patient? No      Pain Assessment   Currently in Pain? No/denies                Social History   Tobacco Use  Smoking Status Never  Smokeless Tobacco Never    Goals Met:  Independence with exercise equipment Exercise tolerated well No report of concerns or symptoms today Strength training completed today  Goals Unmet:  Not Applicable  Comments: Pt able to follow exercise prescription today without complaint.  Will continue to monitor for progression.    Dr. Emily Filbert is Medical Director for [place].  Dr. Ottie Glazier is Medical Director for [facility] Pulmonary Rehabilitation.

## 2022-01-24 ENCOUNTER — Encounter: Payer: BC Managed Care – PPO | Admitting: *Deleted

## 2022-01-24 VITALS — Ht 65.0 in | Wt 103.1 lb

## 2022-01-24 DIAGNOSIS — I5022 Chronic systolic (congestive) heart failure: Secondary | ICD-10-CM

## 2022-01-24 NOTE — Progress Notes (Signed)
Daily Session Note  Patient Details  Name: Cassaundra Rasch MRN: [id number] Date of Birth: 1955/05/18 Referring Provider:   Flowsheet Row Cardiac Rehab from 09/21/2021 in [facility] Cardiac and Pulmonary Rehab  Referring Provider Byku       Encounter Date: 01/24/2022  Check In:  Session Check In - 01/24/22 1730       Check-In   Supervising physician immediately available to respond to emergencies See telemetry face sheet for immediately available ER MD    Location [facility]-Cardiac & Pulmonary Rehab    Staff Present Renita Papa, RN BSN;Noah Tickle, BS, Exercise Physiologist;Joseph Tessie Fass, [place]    Virtual Visit No    Medication changes reported     No    Fall or balance concerns reported    No    Warm-up and Cool-down Performed on first and last piece of equipment    Resistance Training Performed Yes    VAD Patient? No    PAD/SET Patient? No      Pain Assessment   Currently in Pain? No/denies                Social History   Tobacco Use  Smoking Status Never  Smokeless Tobacco Never    Goals Met:  Independence with exercise equipment Exercise tolerated well No report of concerns or symptoms today Strength training completed today  Goals Unmet:  Not Applicable  Comments: Pt able to follow exercise prescription today without complaint.  Will continue to monitor for progression.  [place] Name 09/21/21 1036 01/24/22 1753       6 Minute Walk   Phase Initial Discharge    Distance 700 feet 800 feet    Distance % Change -- 14.3 %    Distance Feet Change -- 100 ft    Walk Time 5.83 minutes 3.82 minutes    # of Rest Breaks 1 0    MPH 1.36 2.38    METS 2.39 2.85    RPE 11 9    Perceived Dyspnea  0 0    VO2 Peak 8.36 9.96    Symptoms No Yes (comment)    Comments -- Felt sick to stomach and had to stop    Resting HR 76 bpm 103 bpm    Resting BP 84/58 112/68    Resting Oxygen Saturation  100 % 98 %    Exercise Oxygen Saturation  during 6 min  walk 100 % 96 %    Max Ex. HR 90 bpm 107 bpm    Max Ex. BP 96/60 118/86    2 Minute Post BP 82/54 --               Dr. Emily Filbert is Medical Director for [place].  Dr. Ottie Glazier is Medical Director for [facility] Pulmonary Rehabilitation.

## 2022-01-24 NOTE — Patient Instructions (Signed)
Discharge Patient Instructions  Patient Details  Name: Dorothy Mann MRN: [id number] Date of Birth: [birth year] Referring Provider:  Lynnell Jude, MD   Number of Visits: 28  Reason for Discharge: Pt reached 18 weeks in the program.  Diagnosis:  Chronic systolic HF (heart failure) ([place])  Heart failure, chronic systolic [facility])  Initial Exercise Prescription:  Initial Exercise Prescription - 09/21/21 1100       Date of Initial Exercise RX and Referring Provider   Date 09/21/21    Referring Provider Byku      Oxygen   Maintain Oxygen Saturation 88% or higher      Treadmill   MPH 1.3    Grade 0    Minutes 15    METs 2      NuStep   Level 1    SPM 80    Minutes 15    METs 2.39      REL-XR   Level 1    Minutes 15    METs 2.39      Biostep-RELP   Level 1    SPM 60    Minutes 15    METs 2.39      Track   Laps 20    Minutes 15    METs 2.09      Prescription Details   Frequency (times per week) 3    Duration Progress to 30 minutes of continuous aerobic without signs/symptoms of physical distress      Intensity   THRR 40-80% of Max Heartrate 108-140    Ratings of Perceived Exertion 11-15    Perceived Dyspnea 0-4      Progression   Progression Continue to progress workloads to maintain intensity without signs/symptoms of physical distress.      Resistance Training   Training Prescription Yes    Weight 2 lb    Reps 10-15             Discharge Exercise Prescription (Final Exercise Prescription Changes):  Exercise Prescription Changes - 01/24/22 1100       Response to Exercise   Blood Pressure (Admit) 112/60    Blood Pressure (Exit) 102/62    Heart Rate (Admit) 79 bpm    Heart Rate (Exercise) 102 bpm    Heart Rate (Exit) 90 bpm    Rating of Perceived Exertion (Exercise) 13    Symptoms none    Duration Continue with 30 min of aerobic exercise without signs/symptoms of physical distress.    Intensity THRR unchanged      Progression    Progression Continue to progress workloads to maintain intensity without signs/symptoms of physical distress.    Average METs 2.82      Resistance Training   Training Prescription Yes    Weight 2 lb    Reps 10-15      Interval Training   Interval Training No      NuStep   Level 2    Minutes 15    METs 2      Track   Laps 51   hallway;  = 42.5 track   Minutes 15    METs 3.28      Home Exercise Plan   Plans to continue exercise at Home (comment)   walking, stretching   Frequency Add 2 additional days to program exercise sessions.    Initial Home Exercises Provided 12/16/21      Oxygen   Maintain Oxygen Saturation 88% or higher  Functional Capacity:  6 Minute Walk     Row Name 09/21/21 1036 01/24/22 1753       6 Minute Walk   Phase Initial Discharge    Distance 700 feet 800 feet    Distance % Change -- 14.3 %    Distance Feet Change -- 100 ft    Walk Time 5.83 minutes 3.82 minutes    # of Rest Breaks 1 0    MPH 1.36 2.38    METS 2.39 2.85    RPE 11 9    Perceived Dyspnea  0 0    VO2 Peak 8.36 9.96    Symptoms No Yes (comment)    Comments -- Felt sick to stomach and had to stop    Resting HR 76 bpm 103 bpm    Resting BP 84/58 112/68    Resting Oxygen Saturation  100 % 98 %    Exercise Oxygen Saturation  during 6 min walk 100 % 96 %    Max Ex. HR 90 bpm 107 bpm    Max Ex. BP 96/60 118/86    2 Minute Post BP 82/54 --             Quality of Life:  Quality of Life - 09/21/21 1344       Quality of Life   Select Quality of Life      Quality of Life Scores   Health/Function Pre 26.6 %    Socioeconomic Pre 28.75 %    Psych/Spiritual Pre 29.14 %    Family Pre 26.8 %    GLOBAL Pre 27.63 %            Nutrition & Weight - Outcomes:  Pre Biometrics - 09/21/21 1038       Pre Biometrics   Height '5\' 5"'$  (1.651 m)    Weight 101 lb 11.2 oz (46.1 kg)    BMI (Calculated) 16.92    Single Leg Stand 3.8 seconds             Post  Biometrics - 01/24/22 1756        Post  Biometrics   Height '5\' 5"'$  (1.651 m)    Weight 103 lb 1.6 oz (46.8 kg)    BMI (Calculated) 17.16    Single Leg Stand 1.8 seconds             Nutrition:  Nutrition Therapy & Goals - 12/02/21 1302       Nutrition Therapy   Diet High calorie, high protein    Protein (specify units) 80-90g    Fiber 21 grams    Whole Grain Foods 1 servings   Pt has low appetite   Saturated Fats 17 max. grams   meeting protein/calorie needs more important   Fruits and Vegetables 5 servings/day   Pt has low appetite   Sodium 2 grams   meeting protein/calorie needs more important     Personal Nutrition Goals   Nutrition Goal ST: try nutritional shake, drink in between meals, add protein and fat to meals/snacks, eat more frequent meals LT: meet calorie and protein needs    Comments 66 y.o. F admitted to cardiac rehab for CHF. PMHx includes HTN, gout, afib, breast cancer s/p mastectomy and adriamycin chemotherapy and non-ischemic cardiomyopathy (EF 20-25% with recovery to 40-45% now severely reduced 10-15%). Relevant medications includes vit D3, ferrous sulfate, folic acid, furosemide, hydrocodone, jardiance, imodium, mag-ox, K+, Vit B12. PYP Score: 52. Vegetables & Fruits 8/12. Breads, Grains & Cereals 6/12.  Red & Processed Meat 5/12. Poultry 0/2. Fish & Shellfish 2/4. Beans, Nuts & Seeds 2/4. Milk & Dairy Foods 3/6. Toppings, Oils, Seasonings & Salt 9/20. Sweets, Snacks & Restaurant Food 7/14. Beverages 10/10.  She will not eat normally until 1pm and the last time she eats is about 8pm. B: OJ, jello cups or cereal (2% milk, pops) L: sandwich (white bread with mayo and deli meat like bolonga) S: jello pack or sliced apples with PB butter D: chicken salad on crossaint with lays potato chips. Drinks: water. Angie reports that her MD gave her a medication to help with appetite at the beginning of the year, but she no longer takes them as her appetite is back to normal. Angie  would like to gain weight back. Discussed high calorie/high protein MNT; adding beans to grains, greek yogurt and butter to potatoes, oil to vegetables, peanut butter, etc.  Reviewed general heart healthy eating.      Intervention Plan   Intervention Prescribe, educate and counsel regarding individualized specific dietary modifications aiming towards targeted core components such as weight, hypertension, lipid management, diabetes, heart failure and other comorbidities.    Expected Outcomes Short Term Goal: Understand basic principles of dietary content, such as calories, fat, sodium, cholesterol and nutrients.;Long Term Goal: Adherence to prescribed nutrition plan.;Short Term Goal: A plan has been developed with personal nutrition goals set during dietitian appointment.

## 2022-01-26 ENCOUNTER — Encounter: Payer: BC Managed Care – PPO | Admitting: *Deleted

## 2022-01-26 DIAGNOSIS — I5022 Chronic systolic (congestive) heart failure: Secondary | ICD-10-CM

## 2022-01-26 NOTE — Progress Notes (Signed)
Daily Session Note  Patient Details  Name: Dorothy Mann MRN: [id number] Date of Birth: [birth year] Referring Provider:   Flowsheet Row Cardiac Rehab from 09/21/2021 in [facility] Cardiac and Pulmonary Rehab  Referring Provider Byku       Encounter Date: 01/26/2022  Check In:  Session Check In - 01/26/22 1736       Check-In   Supervising physician immediately available to respond to emergencies See telemetry face sheet for immediately available ER MD    Location [facility]-Cardiac & Pulmonary Rehab    Staff Present Renita Papa, RN BSN;Joseph [place], RCP,RRT,BSRT;Kelly [place], [place], ACSM CEP, Exercise Physiologist    Virtual Visit No    Medication changes reported     No    Fall or balance concerns reported    No    Warm-up and Cool-down Performed on first and last piece of equipment    Resistance Training Performed Yes    VAD Patient? No    PAD/SET Patient? No      Pain Assessment   Currently in Pain? No/denies                Social History   Tobacco Use  Smoking Status Never  Smokeless Tobacco Never    Goals Met:  Independence with exercise equipment Exercise tolerated well No report of concerns or symptoms today Strength training completed today  Goals Unmet:  Not Applicable  Comments: Pt able to follow exercise prescription today without complaint.  Will continue to monitor for progression.    Dr. Emily Filbert is Medical Director for [place].  Dr. Ottie Glazier is Medical Director for [facility] Pulmonary Rehabilitation.

## 2022-01-27 ENCOUNTER — Encounter: Payer: BC Managed Care – PPO | Admitting: *Deleted

## 2022-01-27 DIAGNOSIS — I5022 Chronic systolic (congestive) heart failure: Secondary | ICD-10-CM

## 2022-01-27 NOTE — Progress Notes (Signed)
Daily Session Note  Patient Details  Name: Dorothy Mann MRN: [id number] Date of Birth: 03/17/1955 Referring Provider:   Flowsheet Row Cardiac Rehab from 09/21/2021 in [facility] Cardiac and Pulmonary Rehab  Referring Provider Byku       Encounter Date: 01/27/2022  Check In:  Session Check In - 01/27/22 1730       Check-In   Supervising physician immediately available to respond to emergencies See telemetry face sheet for immediately available ER MD    Location [facility]-Cardiac & Pulmonary Rehab    Staff Present Renita Papa, RN BSN;Joseph Tessie Fass, RCP,RRT,BSRT;Noah [place], [place], Exercise Physiologist    Virtual Visit No    Medication changes reported     No    Fall or balance concerns reported    No    Warm-up and Cool-down Performed on first and last piece of equipment    Resistance Training Performed Yes    VAD Patient? No    PAD/SET Patient? No      Pain Assessment   Currently in Pain? No/denies                Social History   Tobacco Use  Smoking Status Never  Smokeless Tobacco Never    Goals Met:  Independence with exercise equipment Exercise tolerated well No report of concerns or symptoms today Strength training completed today  Goals Unmet:  Not Applicable  Comments: Pt able to follow exercise prescription today without complaint.  Will continue to monitor for progression.    Dr. Emily Filbert is Medical Director for [place].  Dr. Ottie Glazier is Medical Director for [facility] Pulmonary Rehabilitation.

## 2022-02-02 ENCOUNTER — Encounter: Payer: BC Managed Care – PPO | Admitting: *Deleted

## 2022-02-02 ENCOUNTER — Encounter: Payer: Self-pay | Admitting: *Deleted

## 2022-02-02 DIAGNOSIS — I5022 Chronic systolic (congestive) heart failure: Secondary | ICD-10-CM

## 2022-02-02 NOTE — Progress Notes (Signed)
Daily Session Note  Patient Details  Name: Dorothy Mann MRN: [id number] Date of Birth: 1955-12-31 Referring Provider:   Flowsheet Row Cardiac Rehab from 09/21/2021 in [facility] Cardiac and Pulmonary Rehab  Referring Provider Byku       Encounter Date: 02/02/2022  Check In:  Session Check In - 02/02/22 1727       Check-In   Supervising physician immediately available to respond to emergencies See telemetry face sheet for immediately available ER MD    Location [facility]-Cardiac & Pulmonary Rehab    Staff Present Renita Papa, RN BSN;Kristen Coble, RN,BC,MSN;Kara Maricela Bo, MS, ACSM CEP, Exercise Physiologist    Virtual Visit No    Medication changes reported     No    Fall or balance concerns reported    No    Warm-up and Cool-down Performed on first and last piece of equipment    Resistance Training Performed Yes    VAD Patient? No    PAD/SET Patient? No      Pain Assessment   Currently in Pain? No/denies                Social History   Tobacco Use  Smoking Status Never  Smokeless Tobacco Never    Goals Met:  Independence with exercise equipment Exercise tolerated well No report of concerns or symptoms today Strength training completed today  Goals Unmet:  Not Applicable  Comments: Pt able to follow exercise prescription today without complaint.  Will continue to monitor for progression.    Dr. Emily Filbert is Medical Director for [place].  Dr. Ottie Glazier is Medical Director for [facility] Pulmonary Rehabilitation.

## 2022-02-02 NOTE — Progress Notes (Signed)
Cardiac Individual Treatment Plan  Patient Details  Name: Roseana Rhine MRN: [id number] Date of Birth: [birth year] Referring Provider:   Flowsheet Row Cardiac Rehab from 09/21/2021 in [facility] Cardiac and Pulmonary Rehab  Referring Provider Byku       Initial Encounter Date:  Flowsheet Row Cardiac Rehab from 09/21/2021 in [facility] Cardiac and Pulmonary Rehab  Date 09/21/21       Visit Diagnosis: Chronic systolic HF (heart failure) ([place])  Patient's Home Medications on Admission:  Current Outpatient Medications:    alendronate (FOSAMAX) 70 MG tablet, Take by mouth., Disp: , Rfl:    allopurinol (ZYLOPRIM) 100 MG tablet, Take 100 mg by mouth daily., Disp: , Rfl:    Cholecalciferol 25 MCG (1000 UT) tablet, Take by mouth., Disp: , Rfl:    colchicine 0.6 MG tablet, Take 0.6 mg by mouth daily., Disp: , Rfl:    ferrous sulfate 325 (65 FE) MG tablet, Take by mouth. (Patient not taking: Reported on 09/15/2021), Disp: , Rfl:    folic acid (FOLVITE) 539 MCG tablet, Take by mouth., Disp: , Rfl:    furosemide (LASIX) 20 MG tablet, 40 mg., Disp: , Rfl:    gabapentin (NEURONTIN) 100 MG capsule, Take by mouth., Disp: , Rfl:    gabapentin (NEURONTIN) 100 MG capsule, as needed., Disp: , Rfl:    HYDROcodone-acetaminophen (NORCO/VICODIN) 5-325 MG tablet, Take 1-2 tablets by mouth every 6 (six) hours as needed. (Patient not taking: Reported on 09/15/2021), Disp: 21 tablet, Rfl: 0   JARDIANCE 10 MG TABS tablet, Take 10 mg by mouth daily., Disp: , Rfl:    loperamide (IMODIUM) 2 MG capsule, Take by mouth as needed., Disp: , Rfl:    magnesium oxide (MAG-OX) 400 MG tablet, Take 800 mg by mouth., Disp: , Rfl:    metoprolol succinate (TOPROL-XL) 50 MG 24 hr tablet, Take by mouth., Disp: , Rfl:    potassium chloride SA (KLOR-CON M) 20 MEQ tablet, Take 40 mEq by mouth daily. (Patient not taking: Reported on 09/15/2021), Disp: , Rfl:    sacubitril-valsartan (ENTRESTO) 24-26 MG, Take 1 tablet by mouth 2 (two) times daily., Disp:  , Rfl:    spironolactone (ALDACTONE) 25 MG tablet, Take by mouth., Disp: , Rfl:    spironolactone (ALDACTONE) 25 MG tablet, Take 0.5 tablets by mouth daily., Disp: , Rfl:    vitamin B-12 (CYANOCOBALAMIN) 100 MCG tablet, Take by mouth., Disp: , Rfl:   Past Medical History: Past Medical History:  Diagnosis Date   Breast cancer ([place]) 1997   left breast   Cancer ([place])    breast cancer   Hypertension    Personal history of chemotherapy    Personal history of radiation therapy     Tobacco Use: Social History   Tobacco Use  Smoking Status Never  Smokeless Tobacco Never    Labs: Review Flowsheet        No data to display           Exercise Target Goals: Exercise Program Goal: Individual exercise prescription set using results from initial 6 min walk test and THRR while considering  patient's activity barriers and safety.   Exercise Prescription Goal: Initial exercise prescription builds to 30-45 minutes a day of aerobic activity, 2-3 days per week.  Home exercise guidelines will be given to patient during program as part of exercise prescription that the participant will acknowledge.   Education: Aerobic Exercise: - Group verbal and visual presentation on the components of exercise prescription. Introduces F.I.T.T principle from ACSM  for exercise prescriptions.  Reviews F.I.T.T. principles of aerobic exercise including progression. Written material given at graduation. Flowsheet Row Cardiac Rehab from 09/21/2021 in [facility] Cardiac and Pulmonary Rehab  Education need identified 09/21/21       Education: Resistance Exercise: - Group verbal and visual presentation on the components of exercise prescription. Introduces F.I.T.T principle from ACSM for exercise prescriptions  Reviews F.I.T.T. principles of resistance exercise including progression. Written material given at graduation.    Education: Exercise & Equipment Safety: - Individual verbal instruction and demonstration  of equipment use and safety with use of the equipment. Flowsheet Row Cardiac Rehab from 09/21/2021 in [facility] Cardiac and Pulmonary Rehab  Education need identified 09/21/21  Date 09/21/21  Educator NT  Instruction Review Code 1- Verbalizes Understanding       Education: Exercise Physiology & General Exercise Guidelines: - Group verbal and written instruction with models to review the exercise physiology of the cardiovascular system and associated critical values. Provides general exercise guidelines with specific guidelines to those with heart or lung disease.  Flowsheet Row Cardiac Rehab from 09/21/2021 in [facility] Cardiac and Pulmonary Rehab  Education need identified 09/21/21       Education: Flexibility, Balance, Mind/Body Relaxation: - Group verbal and visual presentation with interactive activity on the components of exercise prescription. Introduces F.I.T.T principle from ACSM for exercise prescriptions. Reviews F.I.T.T. principles of flexibility and balance exercise training including progression. Also discusses the mind body connection.  Reviews various relaxation techniques to help reduce and manage stress (i.e. Deep breathing, progressive muscle relaxation, and visualization). Balance handout provided to take home. Written material given at graduation.   Activity Barriers & Risk Stratification:  Activity Barriers & Cardiac Risk Stratification - 09/21/21 1039       Activity Barriers & Cardiac Risk Stratification   Activity Barriers Joint Problems;Balance Concerns;Muscular Weakness;Deconditioning    Cardiac Risk Stratification High             6 Minute Walk:  6 Minute Walk     Row Name 09/21/21 1036 01/24/22 1753       6 Minute Walk   Phase Initial Discharge    Distance 700 feet 800 feet    Distance % Change -- 14.3 %    Distance Feet Change -- 100 ft    Walk Time 5.83 minutes 3.82 minutes    # of Rest Breaks 1 0    MPH 1.36 2.38    METS 2.39 2.85    RPE 11 9     Perceived Dyspnea  0 0    VO2 Peak 8.36 9.96    Symptoms No Yes (comment)    Comments -- Felt sick to stomach and had to stop    Resting HR 76 bpm 103 bpm    Resting BP 84/58 112/68    Resting Oxygen Saturation  100 % 98 %    Exercise Oxygen Saturation  during 6 min walk 100 % 96 %    Max Ex. HR 90 bpm 107 bpm    Max Ex. BP 96/60 118/86    2 Minute Post BP 82/54 --             Oxygen Initial Assessment:   Oxygen Re-Evaluation:   Oxygen Discharge (Final Oxygen Re-Evaluation):   Initial Exercise Prescription:  Initial Exercise Prescription - 09/21/21 1100       Date of Initial Exercise RX and Referring Provider   Date 09/21/21    Referring Provider [facility]  Oxygen   Maintain Oxygen Saturation 88% or higher      Treadmill   MPH 1.3    Grade 0    Minutes 15    METs 2      NuStep   Level 1    SPM 80    Minutes 15    METs 2.39      REL-XR   Level 1    Minutes 15    METs 2.39      Biostep-RELP   Level 1    SPM 60    Minutes 15    METs 2.39      Track   Laps 20    Minutes 15    METs 2.09      Prescription Details   Frequency (times per week) 3    Duration Progress to 30 minutes of continuous aerobic without signs/symptoms of physical distress      Intensity   THRR 40-80% of Max Heartrate 108-140    Ratings of Perceived Exertion 11-15    Perceived Dyspnea 0-4      Progression   Progression Continue to progress workloads to maintain intensity without signs/symptoms of physical distress.      Resistance Training   Training Prescription Yes    Weight 2 lb    Reps 10-15             Perform Capillary Blood Glucose checks as needed.  Exercise Prescription Changes:   Exercise Prescription Changes     Row Name 09/21/21 1100 10/04/21 1400 10/20/21 0800 11/15/21 1700 11/29/21 1500     Response to Exercise   Blood Pressure (Admit) 84/58 108/64 104/64 98/56 102/54   Blood Pressure (Exercise) 96/60 102/56 112/60 -- --   Blood Pressure  (Exit) 82/54 100/60 100/60 88/60 96/58   Heart Rate (Admit) 76 bpm 76 bpm 68 bpm 68 bpm 78 bpm   Heart Rate (Exercise) 90 bpm 93 bpm 97 bpm 104 bpm 106 bpm   Heart Rate (Exit) 74 bpm 81 bpm 75 bpm 88 bpm 76 bpm   Oxygen Saturation (Admit) 100 % -- -- -- --   Oxygen Saturation (Exercise) 100 % -- -- -- --   Oxygen Saturation (Exit) 100 % -- -- -- --   Rating of Perceived Exertion (Exercise) _0 Perceived Dyspnea (Exercise) 0 -- -- -- --   Symptoms _1    Comments 6MWT results third full day of exercise -- -- --   Duration Progress to 30 minutes of  aerobic without signs/symptoms of physical distress Progress to 30 minutes of  aerobic without signs/symptoms of physical distress Progress to 30 minutes of  aerobic without signs/symptoms of physical distress Progress to 30 minutes of  aerobic without signs/symptoms of physical distress Continue with 30 min of aerobic exercise without signs/symptoms of physical distress.   Intensity _2      Progression   Progression -- Continue to progress workloads to maintain intensity without signs/symptoms of physical distress. Continue to progress workloads to maintain intensity without signs/symptoms of physical distress. Continue to progress workloads to maintain intensity without signs/symptoms of physical distress. Continue to progress workloads to maintain intensity without signs/symptoms of physical distress.   Average METs -- 1.6 2.4 1.83 1.88     Resistance Training   Training Prescription -- Yes Yes Yes Yes   Weight -- 2 lb 2 lb 2 lb 2 lb  Reps -- 10-15 10-15 10-15 10-15     Interval Training   Interval Training -- No No No No     NuStep   Level -- 1 -- 1 --   Minutes -- 15 -- 15 --   METs -- 2 -- 2 --     REL-XR   Level -- _0 Minutes -- _1 METs -- 1.1 3 1.5 2     Track   Laps -- 20 20 -- 14  18 in the hallway    Minutes -- 15 15 -- 15   METs -- 1.8 1.8 -- 1.76     Oxygen   Maintain Oxygen Saturation -- 88% or higher 88% or higher 88% or higher 88% or higher    Row Name 12/13/21 1400 12/16/21 1400 12/27/21 1400 01/10/22 1400 01/24/22 1100     Response to Exercise   Blood Pressure (Admit) 112/58 -- 112/62 112/62 112/60   Blood Pressure (Exit) 98/58 -- 96/58 104/56 102/62   Heart Rate (Admit) 72 bpm -- 79 bpm 88 bpm 79 bpm   Heart Rate (Exercise) 119 bpm -- 103 bpm 115 bpm 102 bpm   Heart Rate (Exit) 89 bpm -- 80 bpm 99 bpm 90 bpm   Rating of Perceived Exertion (Exercise) 13 -- _2 Symptoms none -- none none none   Duration Continue with 30 min of aerobic exercise without signs/symptoms of physical distress. -- Continue with 30 min of aerobic exercise without signs/symptoms of physical distress. Continue with 30 min of aerobic exercise without signs/symptoms of physical distress. Continue with 30 min of aerobic exercise without signs/symptoms of physical distress.   Intensity THRR unchanged -- THRR unchanged THRR unchanged THRR unchanged     Progression   Progression Continue to progress workloads to maintain intensity without signs/symptoms of physical distress. -- Continue to progress workloads to maintain intensity without signs/symptoms of physical distress. Continue to progress workloads to maintain intensity without signs/symptoms of physical distress. Continue to progress workloads to maintain intensity without signs/symptoms of physical distress.   Average METs 1.94 -- 2.29 1.57 2.82     Resistance Training   Training Prescription Yes -- Yes Yes Yes   Weight 2 lb -- 2 lb 2 lb 2 lb   Reps 10-15 -- 10-15 10-15 10-15     Interval Training   Interval Training No -- No No No     NuStep   Level 2 -- _3 Minutes 15 -- _4 METs 2 -- 3 1.7 2     REL-XR   Level 1 -- 1 -- --   Minutes 15 -- 15 -- --   METs -- -- 3 -- --     Track   Laps 20  Hallway -- 22  hallway = 18  laps regular track 10  Hallway 51  hallway;  = 42.5 track   Minutes 15 -- _5 METs 1.87 -- 1.98 1.44 3.28     Home Exercise Plan   Plans to continue exercise at -- Home (comment)  walking, stretching Home (comment)  walking, stretching Home (comment)  walking, stretching Home (comment)  walking, stretching   Frequency -- Add 2 additional days to program exercise sessions. Add 2 additional days to program exercise sessions. Add 2 additional days to program exercise sessions. Add 2 additional days to program exercise sessions.   Initial  Home Exercises Provided -- 12/16/21 12/16/21 12/16/21 12/16/21     Oxygen   Maintain Oxygen Saturation 88% or higher 88% or higher 88% or higher 88% or higher 88% or higher            Exercise Comments:   Exercise Goals and Review:   Exercise Goals     Row Name 09/21/21 1336             Exercise Goals   Increase Physical Activity Yes       Intervention Provide advice, education, support and counseling about physical activity/exercise needs.;Develop an individualized exercise prescription for aerobic and resistive training based on initial evaluation findings, risk stratification, comorbidities and participant's personal goals.       Expected Outcomes Short Term: Attend rehab on a regular basis to increase amount of physical activity.;Long Term: Add in home exercise to make exercise part of routine and to increase amount of physical activity.;Long Term: Exercising regularly at least 3-5 days a week.       Increase Strength and Stamina Yes       Intervention Provide advice, education, support and counseling about physical activity/exercise needs.;Develop an individualized exercise prescription for aerobic and resistive training based on initial evaluation findings, risk stratification, comorbidities and participant's personal goals.       Expected Outcomes Short Term: Increase workloads from initial exercise prescription for resistance, speed,  and METs.;Short Term: Perform resistance training exercises routinely during rehab and add in resistance training at home;Long Term: Improve cardiorespiratory fitness, muscular endurance and strength as measured by increased METs and functional capacity (6MWT)       Able to understand and use rate of perceived exertion (RPE) scale Yes       Intervention Provide education and explanation on how to use RPE scale       Expected Outcomes Short Term: Able to use RPE daily in rehab to express subjective intensity level;Long Term:  Able to use RPE to guide intensity level when exercising independently       Able to understand and use Dyspnea scale Yes       Intervention Provide education and explanation on how to use Dyspnea scale       Expected Outcomes Short Term: Able to use Dyspnea scale daily in rehab to express subjective sense of shortness of breath during exertion;Long Term: Able to use Dyspnea scale to guide intensity level when exercising independently       Knowledge and understanding of Target Heart Rate Range (THRR) Yes       Intervention Provide education and explanation of THRR including how the numbers were predicted and where they are located for reference       Expected Outcomes Short Term: Able to state/look up THRR;Short Term: Able to use daily as guideline for intensity in rehab;Long Term: Able to use THRR to govern intensity when exercising independently       Able to check pulse independently Yes       Intervention Review the importance of being able to check your own pulse for safety during independent exercise;Provide education and demonstration on how to check pulse in carotid and radial arteries.       Expected Outcomes Short Term: Able to explain why pulse checking is important during independent exercise;Long Term: Able to check pulse independently and accurately       Understanding of Exercise Prescription Yes       Intervention Provide education, explanation, and written  materials on patient's individual  exercise prescription       Expected Outcomes Long Term: Able to explain home exercise prescription to exercise independently;Short Term: Able to explain program exercise prescription                Exercise Goals Re-Evaluation :  Exercise Goals Re-Evaluation     Row Name 09/23/21 1739 10/04/21 1420 10/20/21 0806 11/02/21 1544 11/15/21 1729     Exercise Goal Re-Evaluation   Exercise Goals Review Increase Physical Activity;Able to understand and use rate of perceived exertion (RPE) scale;Knowledge and understanding of Target Heart Rate Range (THRR);Understanding of Exercise Prescription;Increase Strength and Stamina;Able to check pulse independently Increase Physical Activity;Increase Strength and Stamina;Understanding of Exercise Prescription Increase Physical Activity;Increase Strength and Stamina;Understanding of Exercise Prescription Increase Physical Activity;Increase Strength and Stamina;Understanding of Exercise Prescription Increase Physical Activity;Increase Strength and Stamina;Understanding of Exercise Prescription   Comments Reviewed RPE and dyspnea scales, THR and program prescription with pt today.  Pt voiced understanding and was given a copy of goals to take home. Kalleigh is off to a good start in rehab.  She has completed her first three full days of exercise thus far.  She is already up to 2 METs on the NuStep. We will continue monitor her progress. Pansy is doing well in rehab. She has consistently walked 20 hallway laps for around 10 min. She also has done well with level 1 on the XR. We will continue to monitor her progress in the program. Melanni has not been here since last review. She has called out several times due to work as well as having problems with low BP. She was encouraged to call her doctor to let them know. We hope to see her attendance improve overtime. Breionna is doing well since returning to rehab. Her attendance has still been  inconsistent but she was able to show up 4 times since the last review. She has continued to tolerate level 1 on the T4 and the XR.Marland Kitchen We will continue to monitor her progress in the program.   Expected Outcomes Short: Use RPE daily to regulate intensity. Long: Follow program prescription in THR. Short: Conitnue to attend rehab regularly Long: Continue to follow program prescription Short: Conitnue to attend rehab regularly Long: Continue to follow program prescription Short: Maintain good attendence Long: Complete [facility] Program Short: Maintain good attendence Long: Complete [place] Name 11/29/21 1510 12/13/21 1413 12/16/21 1402 12/27/21 1413 01/10/22 1414     Exercise Goal Re-Evaluation   Exercise Goals Review Increase Physical Activity;Increase Strength and Stamina;Understanding of Exercise Prescription Increase Physical Activity;Increase Strength and Stamina;Understanding of Exercise Prescription Understanding of Exercise Prescription;Knowledge and understanding of Target Heart Rate Range (THRR);Able to understand and use rate of perceived exertion (RPE) scale;Increase Physical Activity;Increase Strength and Stamina;Able to understand and use Dyspnea scale;Able to check pulse independently Increase Physical Activity;Increase Strength and Stamina;Able to understand and use rate of perceived exertion (RPE) scale Increase Physical Activity;Increase Strength and Stamina;Understanding of Exercise Prescription   Comments Angie's attendance with rehab is very sporadic. Some sessions are missed and she sometimes does not get a full session in due to her work schedule leaving late. Staff discussed with her the attendance policy and how she would not benefit from her current attendance. Patient spoke with her boss who is letting her leave early so she can attend reahb in full. She was offered to come 2x/week, however, patient opted to stay with 3 and stated she will be more compliant with  attendance.  Last session she was here she walked 18 laps in the hallway. We will continue to monitor and hope to see more progression with better attendance. Janace Hoard is doing well in rehab. Her attendance was much more consistent since the last evaluation. She also increased her overall MET level to 1.94 METs. She was able to walk up to 20 laps in the hallway also. We will continue to monitor her progress in the program. Reviewed home exercise with pt today.  Pt plans to walk and do strengthening/stretches for exercise.  Reviewed THR, pulse, RPE, sign and symptoms, pulse oximetery and when to call 911 or MD.  Also discussed weather considerations and indoor options.  Pt voiced understanding. Cybill has been better about being present on rehab days, however, she shows up 15 minutes late each time and therefore constricts her time with the exercise as she usually only has time do 1 machine. Patient comes from work and has tried to get here earlier but has not been able to. She was able to walk 22 laps in the hallway, which is equivalent to 18 on the track. She would benefit from increasing her workload to level 2 on the XR. We tried to increase her to 3 lb handweights, however, she was unable to perform the exercises appropriately as they were too heavy. Her 18 weeks will be up next month and staff already informed patient. Will continue to monitor. Sheril has only attended rehab once since the last review.  She has continued to show up late to rehab due to work, and is usually only able to use one machine. She has progressed to level 2 on the T4 but was only able to walk 10 laps in the hallway. We will continue to monitor her progress in the program.   Expected Outcomes Short: Increase level on T4 and XR Long: Increase overall MET level Short: Increase level on T4 and XR Long: Continue to increase strength and stamina. Short: Add on 1 day of exercise at home, check HR Long: Continue to exercise independently Short:  Increase to level 2 on XR Long: Continue to increase overall MET level Short: Continue to push for more laps while walking. Long: Continue to increase strength and stamina.    [place] Name 01/24/22 1114             Exercise Goal Re-Evaluation   Exercise Goals Review Increase Physical Activity;Increase Strength and Stamina;Understanding of Exercise Prescription       Comments Maxene missed another week of exercise. She is due to graduate by the end of the month as she has exceeded her 18 weeks. She did however, walk the entire time she was in class and ended up getting 51 laps on the track!! that is most she has ever worked, she worked over 3.2 METS. She is due for her post 6MWT and hope to see improvement. Will continue to monitor until graduation.       Expected Outcomes Short: Improve on post 6MWT Long: Continue to increase overall MET level                Discharge Exercise Prescription (Final Exercise Prescription Changes):  Exercise Prescription Changes - 01/24/22 1100       Response to Exercise   Blood Pressure (Admit) 112/60    Blood Pressure (Exit) 102/62    Heart Rate (Admit) 79 bpm    Heart Rate (Exercise) 102 bpm    Heart Rate (Exit) 90 bpm    Rating of  Perceived Exertion (Exercise) 13    Symptoms none    Duration Continue with 30 min of aerobic exercise without signs/symptoms of physical distress.    Intensity THRR unchanged      Progression   Progression Continue to progress workloads to maintain intensity without signs/symptoms of physical distress.    Average METs 2.82      Resistance Training   Training Prescription Yes    Weight 2 lb    Reps 10-15      Interval Training   Interval Training No      NuStep   Level 2    Minutes 15    METs 2      Track   Laps 51   hallway;  = 42.5 track   Minutes 15    METs 3.28      Home Exercise Plan   Plans to continue exercise at Home (comment)   walking, stretching   Frequency Add 2 additional days to program  exercise sessions.    Initial Home Exercises Provided 12/16/21      Oxygen   Maintain Oxygen Saturation 88% or higher             Nutrition:  Target Goals: Understanding of nutrition guidelines, daily intake of sodium <1557m, cholesterol <2037m calories 30% from fat and 7% or less from saturated fats, daily to have 5 or more servings of fruits and vegetables.  Education: All About Nutrition: -Group instruction provided by verbal, written material, interactive activities, discussions, models, and posters to present general guidelines for heart healthy nutrition including fat, fiber, MyPlate, the role of sodium in heart healthy nutrition, utilization of the nutrition label, and utilization of this knowledge for meal planning. Follow up email sent as well. Written material given at graduation.   Biometrics:  Pre Biometrics - 09/21/21 1038       Pre Biometrics   Height _0  (1.651 m)    Weight 101 lb 11.2 oz (46.1 kg)    BMI (Calculated) 16.92    Single Leg Stand 3.8 seconds             Post Biometrics - 01/24/22 1756        Post  Biometrics   Height _1  (1.651 m)    Weight 103 lb 1.6 oz (46.8 kg)    BMI (Calculated) 17.16    Single Leg Stand 1.8 seconds             Nutrition Therapy Plan and Nutrition Goals:  Nutrition Therapy & Goals - 12/02/21 1302       Nutrition Therapy   Diet High calorie, high protein    Protein (specify units) 80-90g    Fiber 21 grams    Whole Grain Foods 1 servings   Pt has low appetite   Saturated Fats 17 max. grams   meeting protein/calorie needs more important   Fruits and Vegetables 5 servings/day   Pt has low appetite   Sodium 2 grams   meeting protein/calorie needs more important     Personal Nutrition Goals   Nutrition Goal ST: try nutritional shake, drink in between meals, add protein and fat to meals/snacks, eat more frequent meals LT: meet calorie and protein needs    Comments [id number]. F admitted to cardiac rehab for  CHF. PMHx includes HTN, gout, afib, breast cancer s/p mastectomy and adriamycin chemotherapy and non-ischemic cardiomyopathy (EF 20-25% with recovery to 40-45% now severely reduced 10-15%). Relevant medications includes vit D3, ferrous sulfate, folic  acid, furosemide, hydrocodone, jardiance, imodium, mag-ox, K+, Vit B12. PYP Score: 52. Vegetables & Fruits 8/12. Breads, Grains & Cereals 6/12. Red & Processed Meat 5/12. Poultry 0/2. Fish & Shellfish 2/4. Beans, Nuts & Seeds 2/4. Milk & Dairy Foods 3/6. Toppings, Oils, Seasonings & Salt 9/20. Sweets, Snacks & Restaurant Food 7/14. Beverages 10/10.  She will not eat normally until 1pm and the last time she eats is about 8pm. B: OJ, jello cups or cereal (2% milk, pops) L: sandwich (white bread with mayo and deli meat like bolonga) S: jello pack or sliced apples with PB butter D: chicken salad on crossaint with lays potato chips. Drinks: water. Angie reports that her MD gave her a medication to help with appetite at the beginning of the year, but she no longer takes them as her appetite is back to normal. Angie would like to gain weight back. Discussed high calorie/high protein MNT; adding beans to grains, greek yogurt and butter to potatoes, oil to vegetables, peanut butter, etc.  Reviewed general heart healthy eating.      Intervention Plan   Intervention Prescribe, educate and counsel regarding individualized specific dietary modifications aiming towards targeted core components such as weight, hypertension, lipid management, diabetes, heart failure and other comorbidities.    Expected Outcomes Short Term Goal: Understand basic principles of dietary content, such as calories, fat, sodium, cholesterol and nutrients.;Long Term Goal: Adherence to prescribed nutrition plan.;Short Term Goal: A plan has been developed with personal nutrition goals set during dietitian appointment.             Nutrition Assessments:  MEDIFICTS Score Key: ?70 Need to make  dietary changes  40-70 Heart Healthy Diet ? 40 Therapeutic Level Cholesterol Diet  Flowsheet Row Cardiac Rehab from 09/21/2021 in [facility] Cardiac and Pulmonary Rehab  Picture Your Plate Total Score on Admission 52      Picture Your Plate Scores: <80 Unhealthy dietary pattern with much room for improvement. 41-50 Dietary pattern unlikely to meet recommendations for good health and room for improvement. 51-60 More healthful dietary pattern, with some room for improvement.  >60 Healthy dietary pattern, although there may be some specific behaviors that could be improved.    Nutrition Goals Re-Evaluation:  Nutrition Goals Re-Evaluation     [place] Name 11/04/21 1747 11/25/21 1753 12/22/21 1739         Goals   Current Weight 98 lb (44.5 kg) 98 lb (44.5 kg) 100 lb (45.4 kg)     Nutrition Goal Gain weight Meet with the dietitian gain more weight     Comment Informed patient of some foods that could help her gain weight. Gave her an option of protien without dairy. (gave patient a printout of product) She is trying to eat more but needs some help intaking more calories. Patient would like to meet with the dietictan and try to gain weight. Angie has been able to put on a couple more pounds. She is drinking more protien shakes and states that her appetite has been better.     Expected Outcome Short: try to drink protien shakes routinely. Long: gain weight. Short: meet with the dietitian.Long: eat a diet that adheres to her needs. Short: gain a few more pounds in the next couple weeks. Long: maintain weight gain independently              Nutrition Goals Discharge (Final Nutrition Goals Re-Evaluation):  Nutrition Goals Re-Evaluation - 12/22/21 1739       Goals   Current  Weight 100 lb (45.4 kg)    Nutrition Goal gain more weight    Comment Angie has been able to put on a couple more pounds. She is drinking more protien shakes and states that her appetite has been better.    Expected Outcome  Short: gain a few more pounds in the next couple weeks. Long: maintain weight gain independently             Psychosocial: Target Goals: Acknowledge presence or absence of significant depression and/or stress, maximize coping skills, provide positive support system. Participant is able to verbalize types and ability to use techniques and skills needed for reducing stress and depression.   Education: Stress, Anxiety, and Depression - Group verbal and visual presentation to define topics covered.  Reviews how body is impacted by stress, anxiety, and depression.  Also discusses healthy ways to reduce stress and to treat/manage anxiety and depression.  Written material given at graduation.   Education: Sleep Hygiene -Provides group verbal and written instruction about how sleep can affect your health.  Define sleep hygiene, discuss sleep cycles and impact of sleep habits. Review good sleep hygiene tips.    Initial Review & Psychosocial Screening:  Initial Psych Review & Screening - 09/15/21 1456       Initial Review   Current issues with Current Stress Concerns    Source of Stress Concerns Chronic Illness    Comments Mozelle reports stress from trying to decide what her next steps are medically: transplant or LVAD. She relies on her husband and a couple of sister who live close to support her. She is still working and would like to work around her schedule to attend cardiac rehab; she would like to increase her strength and balance as well.  She reports no sleep issues at this time. She likes to shop, cooking, and going to the beach to help to relieve stress.      Family Dynamics   Good Support System? Yes   husband, sister     Barriers   Psychosocial barriers to participate in program The patient should benefit from training in stress management and relaxation.;Psychosocial barriers identified (see note)      Screening Interventions   Interventions Encouraged to exercise;Provide  feedback about the scores to participant;To provide support and resources with identified psychosocial needs    Expected Outcomes Short Term goal: Utilizing psychosocial counselor, staff and physician to assist with identification of specific Stressors or current issues interfering with healing process. Setting desired goal for each stressor or current issue identified.;Long Term Goal: Stressors or current issues are controlled or eliminated.;Short Term goal: Identification and review with participant of any Quality of Life or Depression concerns found by scoring the questionnaire.;Long Term goal: The participant improves quality of Life and PHQ9 Scores as seen by post scores and/or verbalization of changes             Quality of Life Scores:   Quality of Life - 09/21/21 1344       Quality of Life   Select Quality of Life      Quality of Life Scores   Health/Function Pre 26.6 %    Socioeconomic Pre 28.75 %    Psych/Spiritual Pre 29.14 %    Family Pre 26.8 %    GLOBAL Pre 27.63 %            Scores of 19 and below usually indicate a poorer quality of life in these areas.  A difference of  2-3 points is a clinically meaningful difference.  A difference of 2-3 points in the total score of the Quality of Life Index has been associated with significant improvement in overall quality of life, self-image, physical symptoms, and general health in studies assessing change in quality of life.  PHQ-9: Review Flowsheet       09/21/2021 12/07/2017  Depression screen PHQ 2/9  Decreased Interest 0 0  Down, Depressed, Hopeless 0 0  PHQ - 2 Score 0 0  Altered sleeping 0 0  Tired, decreased energy 0 0  Change in appetite 0 0  Feeling bad or failure about yourself  0 0  Trouble concentrating 0 0  Moving slowly or fidgety/restless 0 0  Suicidal thoughts 0 0  PHQ-9 Score 0 0  Difficult doing work/chores Not difficult at all -   Interpretation of Total Score  Total Score Depression  Severity:  1-4 = Minimal depression, 5-9 = Mild depression, 10-14 = Moderate depression, 15-19 = Moderately severe depression, 20-27 = Severe depression   Psychosocial Evaluation and Intervention:  Psychosocial Evaluation - 09/15/21 1502       Psychosocial Evaluation & Interventions   Interventions Stress management education;Relaxation education;Encouraged to exercise with the program and follow exercise prescription    Comments Devyne reports stress from trying to decide what her next steps are medically: transplant or LVAD. She relies on her husband and a couple of sister who live close to support her. She is still working and would like to work around her schedule to attend cardiac rehab; she would like to increase her strength and balance as well.  She reports no sleep issues at this time. She likes to shop, cooking, and going to the beach to help to relieve stress.    Expected Outcomes ST: attend all scheduled cardiac rehab exercise/education sessions, progess with exercise prescription LT: improve strength and progress with exercise prescription independently    Continue Psychosocial Services  Follow up required by staff             Psychosocial Re-Evaluation:  Psychosocial Re-Evaluation     [place] Name 11/04/21 1748 11/25/21 1751 12/22/21 1740         Psychosocial Re-Evaluation   Current issues with None Identified None Identified None Identified     Comments Patient reports no issues with their current mental states, sleep, stress, depression or anxiety. Will follow up with patient in a few weeks for any changes. Karaline states that she has been thinking about being more healthy. She states that she is more determined to workout and get stronger. She has gone to the beach last week and had a relaxing time with her husband. Savera states that she has had more energy since she has been able to eat more. She states no depresson or anxiety and does not take anything for her mood.      Expected Outcomes Short: Continue to exercise regularly to support mental health and notify staff of any changes. Long: maintain mental health and well being through teaching of rehab or prescribed medications independently. Short: attend hearttrack to keep stress at a minimum. Long: maintain exercise and reduce stressors independently. Short: attend hearttrack to keep stress at a minimum. Long: maintain exercise and reduce stressors independently.     Interventions Encouraged to attend Cardiac Rehabilitation for the exercise Encouraged to attend Cardiac Rehabilitation for the exercise Encouraged to attend Cardiac Rehabilitation for the exercise     Continue Psychosocial Services  Follow up required by staff Follow  up required by staff Follow up required by staff              Psychosocial Discharge (Final Psychosocial Re-Evaluation):  Psychosocial Re-Evaluation - 12/22/21 1740       Psychosocial Re-Evaluation   Current issues with None Identified    Comments Mirca states that she has had more energy since she has been able to eat more. She states no depresson or anxiety and does not take anything for her mood.    Expected Outcomes Short: attend hearttrack to keep stress at a minimum. Long: maintain exercise and reduce stressors independently.    Interventions Encouraged to attend Cardiac Rehabilitation for the exercise    Continue Psychosocial Services  Follow up required by staff             Vocational Rehabilitation: Provide vocational rehab assistance to qualifying candidates.   Vocational Rehab Evaluation & Intervention:  Vocational Rehab - 09/15/21 1455       Initial Vocational Rehab Evaluation & Intervention   Assessment shows need for Vocational Rehabilitation No      Vocational Rehab Re-Evaulation   Comments She is still working.             Education: Education Goals: Education classes will be provided on a variety of topics geared toward better understanding  of heart health and risk factor modification. Participant will state understanding/return demonstration of topics presented as noted by education test scores.  Learning Barriers/Preferences:  Learning Barriers/Preferences - 09/15/21 1451       Learning Barriers/Preferences   Learning Barriers None    Learning Preferences None             General Cardiac Education Topics:  AED/CPR: - Group verbal and written instruction with the use of models to demonstrate the basic use of the AED with the basic ABC's of resuscitation.   Anatomy and Cardiac Procedures: - Group verbal and visual presentation and models provide information about basic cardiac anatomy and function. Reviews the testing methods done to diagnose heart disease and the outcomes of the test results. Describes the treatment choices: Medical Management, Angioplasty, or Coronary Bypass Surgery for treating various heart conditions including Myocardial Infarction, Angina, Valve Disease, and Cardiac Arrhythmias.  Written material given at graduation.   Medication Safety: - Group verbal and visual instruction to review commonly prescribed medications for heart and lung disease. Reviews the medication, class of the drug, and side effects. Includes the steps to properly store meds and maintain the prescription regimen.  Written material given at graduation.   Intimacy: - Group verbal instruction through game format to discuss how heart and lung disease can affect sexual intimacy. Written material given at graduation..   Know Your Numbers and Heart Failure: - Group verbal and visual instruction to discuss disease risk factors for cardiac and pulmonary disease and treatment options.  Reviews associated critical values for Overweight/Obesity, Hypertension, Cholesterol, and Diabetes.  Discusses basics of heart failure: signs/symptoms and treatments.  Introduces Heart Failure Zone chart for action plan for heart failure.  Written  material given at graduation.   Infection Prevention: - Provides verbal and written material to individual with discussion of infection control including proper hand washing and proper equipment cleaning during exercise session. Flowsheet Row Cardiac Rehab from 09/21/2021 in [facility] Cardiac and Pulmonary Rehab  Education need identified 09/21/21  Date 09/21/21  Educator NT  Instruction Review Code 1- Verbalizes Understanding       Falls Prevention: - Provides verbal and written material  to individual with discussion of falls prevention and safety. Flowsheet Row Cardiac Rehab from 09/21/2021 in [facility] Cardiac and Pulmonary Rehab  Education need identified 09/15/21  Date 09/15/21  Educator [place]  Instruction Review Code 1- Verbalizes Understanding       Other: -Provides group and verbal instruction on various topics (see comments)   Knowledge Questionnaire Score:  Knowledge Questionnaire Score - 09/21/21 1340       Knowledge Questionnaire Score   Pre Score 23/26             Core Components/Risk Factors/Patient Goals at Admission:  Personal Goals and Risk Factors at Admission - 09/21/21 1351       Core Components/Risk Factors/Patient Goals on Admission    Weight Management Weight Gain;Yes    Intervention Weight Management: Develop a combined nutrition and exercise program designed to reach desired caloric intake, while maintaining appropriate intake of nutrient and fiber, sodium and fats, and appropriate energy expenditure required for the weight goal.;Weight Management: Provide education and appropriate resources to help participant work on and attain dietary goals.    Admit Weight 101 lb 11.2 oz (46.1 kg)    Goal Weight: Short Term 105 lb (47.6 kg)    Goal Weight: Long Term 110 lb (49.9 kg)    Expected Outcomes Short Term: Continue to assess and modify interventions until short term weight is achieved;Long Term: Adherence to nutrition and physical activity/exercise program  aimed toward attainment of established weight goal;Weight Gain: Understanding of general recommendations for a high calorie, high protein meal plan that promotes weight gain by distributing calorie intake throughout the day with the consumption for 4-5 meals, snacks, and/or supplements;Understanding of distribution of calorie intake throughout the day with the consumption of 4-5 meals/snacks    Heart Failure Yes    Intervention Provide a combined exercise and nutrition program that is supplemented with education, support and counseling about heart failure. Directed toward relieving symptoms such as shortness of breath, decreased exercise tolerance, and extremity edema.    Expected Outcomes Short term: Attendance in program 2-3 days a week with increased exercise capacity. Reported lower sodium intake. Reported increased fruit and vegetable intake. Reports medication compliance.;Improve functional capacity of life;Short term: Daily weights obtained and reported for increase. Utilizing diuretic protocols set by physician.;Long term: Adoption of self-care skills and reduction of barriers for early signs and symptoms recognition and intervention leading to self-care maintenance.    Hypertension Yes    Intervention Provide education on lifestyle modifcations including regular physical activity/exercise, weight management, moderate sodium restriction and increased consumption of fresh fruit, vegetables, and low fat dairy, alcohol moderation, and smoking cessation.;Monitor prescription use compliance.    Expected Outcomes Short Term: Continued assessment and intervention until BP is < 140/31m HG in hypertensive participants. < 130/88mHG in hypertensive participants with diabetes, heart failure or chronic kidney disease.;Long Term: Maintenance of blood pressure at goal levels.             Education:Diabetes - Individual verbal and written instruction to review signs/symptoms of diabetes, desired ranges of  glucose level fasting, after meals and with exercise. Acknowledge that pre and post exercise glucose checks will be done for 3 sessions at entry of program.   Core Components/Risk Factors/Patient Goals Review:   Goals and Risk Factor Review     Row Name 11/04/21 1737 11/25/21 1748 12/22/21 1733         Core Components/Risk Factors/Patient Goals Review   Personal Goals Review Weight Management/Obesity Weight Management/Obesity Weight Management/Obesity  Review Zemirah wants to gain weight and has since lost weight since the begining of the program. She is trying to eat more to gain weight. Recommended some protien powder and ways to intake more calories. Shalona wants to gain weight and has not gained weight in the last 3 weeks. Her appetite is hit or miss somedays. She states that she eating ice cream, cookies and candy. She knows she needs to eat more and a better diet. Angie has gone up two pounds in the last couple weeks. She has been trying to intake more calories to help her gain more weight.     Expected Outcomes Short: gain some weight. Long Reach weight goal. Short: try to gain weight in the next few weeks. Long: gain more weight. Short: try to gain another two pounds. Long: reach her weight goal.              Core Components/Risk Factors/Patient Goals at Discharge (Final Review):   Goals and Risk Factor Review - 12/22/21 1733       Core Components/Risk Factors/Patient Goals Review   Personal Goals Review Weight Management/Obesity    Review Angie has gone up two pounds in the last couple weeks. She has been trying to intake more calories to help her gain more weight.    Expected Outcomes Short: try to gain another two pounds. Long: reach her weight goal.             ITP Comments:  ITP Comments     Row Name 09/15/21 1511 09/21/21 1035 09/23/21 1738 10/13/21 1343 11/04/21 1003   ITP Comments Virtual orientation call completed today. She has an appointment on Date:  09/21/21  for EP eval and gym Orientation.  Documentation of diagnosis can be found in [facility] Date: 08/18/21 . Completed 6MWT and gym orientation. Initial ITP created and sent for review to Dr. Emily Filbert, Medical Director. First full day of exercise!  Patient was oriented to gym and equipment including functions, settings, policies, and procedures.  Patient's individual exercise prescription and treatment plan were reviewed.  All starting workloads were established based on the results of the 6 minute walk test done at initial orientation visit.  The plan for exercise progression was also introduced and progression will be customized based on patient's performance and goals. 30 Day review completed. Medical Director ITP review done, changes made as directed, and signed approval by Medical Director.   NEW Called pt to check in as she has only been to rehab 2x this past month; she reports she will be at rehab this afternoon.    Row Name 11/10/21 0751 12/02/21 1400 12/08/21 1023 01/05/22 0851 02/02/22 1114   ITP Comments 30 Day review completed. Medical Director ITP review done, changes made as directed, and signed approval by Medical Director. Completed initial RD consultation 30 Day review completed. Medical Director ITP review done, changes made as directed, and signed approval by Medical Director. 30 Day review completed. Medical Director ITP review done, changes made as directed, and signed approval by Medical Director. 30 Day review completed. Medical Director ITP review done, changes made as directed, and signed approval by Medical Director.            Comments:

## 2022-02-03 ENCOUNTER — Encounter: Payer: BC Managed Care – PPO | Admitting: *Deleted

## 2022-02-03 DIAGNOSIS — I5022 Chronic systolic (congestive) heart failure: Secondary | ICD-10-CM | POA: Diagnosis not present

## 2022-02-03 NOTE — Progress Notes (Signed)
Discharge Summary   Dorothy Mann  DOB: 05/04/1955  [name] graduated today from  rehab with 31 sessions completed.  Details of the patient's exercise prescription and what She needs to do in order to continue the prescription and progress were discussed with patient.  Patient was given a copy of prescription and goals.  Patient verbalized understanding.  [name] plans to continue to exercise by walking.   [place] Name 09/21/21 1036 01/24/22 1753       6 Minute Walk   Phase Initial Discharge    Distance 700 feet 800 feet    Distance % Change -- 14.3 %    Distance Feet Change -- 100 ft    Walk Time 5.83 minutes 3.82 minutes    # of Rest Breaks 1 0    MPH 1.36 2.38    METS 2.39 2.85    RPE 11 9    Perceived Dyspnea  0 0    VO2 Peak 8.36 9.96    Symptoms No Yes (comment)    Comments -- Felt sick to stomach and had to stop    Resting HR 76 bpm 103 bpm    Resting BP 84/58 112/68    Resting Oxygen Saturation  100 % 98 %    Exercise Oxygen Saturation  during 6 min walk 100 % 96 %    Max Ex. HR 90 bpm 107 bpm    Max Ex. BP 96/60 118/86    2 Minute Post BP 82/54 --

## 2022-02-03 NOTE — Progress Notes (Signed)
Daily Session Note  Patient Details  Name: Dorothy Mann MRN: [id number] Date of Birth: Sep 06, 1955 Referring Provider:   Flowsheet Row Cardiac Rehab from 09/21/2021 in [facility]  Referring Provider Byku       Encounter Date: 02/03/2022  Check In:  Session Check In - 02/03/22 [place]       Check-In   Supervising physician immediately available to respond to emergencies See telemetry face sheet for immediately available ER MD    Location [facility]-Cardiac & Pulmonary Rehab    Staff Present Renita Papa, RN BSN;Joseph Tessie Fass, RCP,RRT,BSRT;Kara Jefferson, MS, ACSM CEP, Exercise Physiologist    Virtual Visit No    Medication changes reported     No    Fall or balance concerns reported    No    Warm-up and Cool-down Performed on first and last piece of equipment    Resistance Training Performed Yes    VAD Patient? No    PAD/SET Patient? No      Pain Assessment   Currently in Pain? No/denies                Social History   Tobacco Use  Smoking Status Never  Smokeless Tobacco Never    Goals Met:  Independence with exercise equipment Exercise tolerated well No report of concerns or symptoms today Strength training completed today  Goals Unmet:  Not Applicable  Comments:   Dorothy Mann graduated today from  rehab with 31 sessions completed.  Details of the patient's exercise prescription and what She needs to do in order to continue the prescription and progress were discussed with patient.  Patient was given a copy of prescription and goals.  Patient verbalized understanding.  Dorothy Mann plans to continue to exercise by walking.     Dr. Emily Filbert is Medical Director for [facility].  Dr. Ottie Glazier is Medical Director for [facility] Pulmonary [facility].

## 2022-02-03 NOTE — Progress Notes (Signed)
Cardiac Individual Treatment Plan  Patient Details  Name: Dorothy Mann MRN: [id number] Date of Birth: Feb 21, 1955 Referring Provider:   Flowsheet Row Cardiac Rehab from 09/21/2021 in [facility] Cardiac and Pulmonary Rehab  Referring Provider Byku       Initial Encounter Date:  Flowsheet Row Cardiac Rehab from 09/21/2021 in [facility] Cardiac and Pulmonary Rehab  Date 09/21/21       Visit Diagnosis: Chronic systolic HF (heart failure) ([place])  Heart failure, chronic systolic (HCC)  Patient's Home Medications on Admission:  Current Outpatient Medications:    alendronate (FOSAMAX) 70 MG tablet, Take by mouth., Disp: , Rfl:    allopurinol (ZYLOPRIM) 100 MG tablet, Take 100 mg by mouth daily., Disp: , Rfl:    Cholecalciferol 25 MCG (1000 UT) tablet, Take by mouth., Disp: , Rfl:    colchicine 0.6 MG tablet, Take 0.6 mg by mouth daily., Disp: , Rfl:    ferrous sulfate 325 (65 FE) MG tablet, Take by mouth. (Patient not taking: Reported on 09/15/2021), Disp: , Rfl:    folic acid (FOLVITE) 254 MCG tablet, Take by mouth., Disp: , Rfl:    furosemide (LASIX) 20 MG tablet, 40 mg., Disp: , Rfl:    gabapentin (NEURONTIN) 100 MG capsule, Take by mouth., Disp: , Rfl:    gabapentin (NEURONTIN) 100 MG capsule, as needed., Disp: , Rfl:    HYDROcodone-acetaminophen (NORCO/VICODIN) 5-325 MG tablet, Take 1-2 tablets by mouth every 6 (six) hours as needed. (Patient not taking: Reported on 09/15/2021), Disp: 21 tablet, Rfl: 0   JARDIANCE 10 MG TABS tablet, Take 10 mg by mouth daily., Disp: , Rfl:    loperamide (IMODIUM) 2 MG capsule, Take by mouth as needed., Disp: , Rfl:    magnesium oxide (MAG-OX) 400 MG tablet, Take 800 mg by mouth., Disp: , Rfl:    metoprolol succinate (TOPROL-XL) 50 MG 24 hr tablet, Take by mouth., Disp: , Rfl:    potassium chloride SA (KLOR-CON M) 20 MEQ tablet, Take 40 mEq by mouth daily. (Patient not taking: Reported on 09/15/2021), Disp: , Rfl:    sacubitril-valsartan (ENTRESTO) 24-26 MG, Take 1  tablet by mouth 2 (two) times daily., Disp: , Rfl:    spironolactone (ALDACTONE) 25 MG tablet, Take by mouth., Disp: , Rfl:    spironolactone (ALDACTONE) 25 MG tablet, Take 0.5 tablets by mouth daily., Disp: , Rfl:    vitamin B-12 (CYANOCOBALAMIN) 100 MCG tablet, Take by mouth., Disp: , Rfl:   Past Medical History: Past Medical History:  Diagnosis Date   Breast cancer ([place]) 1997   left breast   Cancer ([place])    breast cancer   Hypertension    Personal history of chemotherapy    Personal history of radiation therapy     Tobacco Use: Social History   Tobacco Use  Smoking Status Never  Smokeless Tobacco Never    Labs: Review Flowsheet        No data to display           Exercise Target Goals: Exercise Program Goal: Individual exercise prescription set using results from initial 6 min walk test and THRR while considering  patient's activity barriers and safety.   Exercise Prescription Goal: Initial exercise prescription builds to 30-45 minutes a day of aerobic activity, 2-3 days per week.  Home exercise guidelines will be given to patient during program as part of exercise prescription that the participant will acknowledge.   Education: Aerobic Exercise: - Group verbal and visual presentation on the components of exercise  prescription. Introduces F.I.T.T principle from ACSM for exercise prescriptions.  Reviews F.I.T.T. principles of aerobic exercise including progression. Written material given at graduation. Flowsheet Row Cardiac Rehab from 09/21/2021 in [facility] Cardiac and Pulmonary Rehab  Education need identified 09/21/21       Education: Resistance Exercise: - Group verbal and visual presentation on the components of exercise prescription. Introduces F.I.T.T principle from ACSM for exercise prescriptions  Reviews F.I.T.T. principles of resistance exercise including progression. Written material given at graduation.    Education: Exercise & Equipment Safety: -  Individual verbal instruction and demonstration of equipment use and safety with use of the equipment. Flowsheet Row Cardiac Rehab from 09/21/2021 in [facility] Cardiac and Pulmonary Rehab  Education need identified 09/21/21  Date 09/21/21  Educator NT  Instruction Review Code 1- Verbalizes Understanding       Education: Exercise Physiology & General Exercise Guidelines: - Group verbal and written instruction with models to review the exercise physiology of the cardiovascular system and associated critical values. Provides general exercise guidelines with specific guidelines to those with heart or lung disease.  Flowsheet Row Cardiac Rehab from 09/21/2021 in [facility] Cardiac and Pulmonary Rehab  Education need identified 09/21/21       Education: Flexibility, Balance, Mind/Body Relaxation: - Group verbal and visual presentation with interactive activity on the components of exercise prescription. Introduces F.I.T.T principle from ACSM for exercise prescriptions. Reviews F.I.T.T. principles of flexibility and balance exercise training including progression. Also discusses the mind body connection.  Reviews various relaxation techniques to help reduce and manage stress (i.e. Deep breathing, progressive muscle relaxation, and visualization). Balance handout provided to take home. Written material given at graduation.   Activity Barriers & Risk Stratification:  Activity Barriers & Cardiac Risk Stratification - 09/21/21 1039       Activity Barriers & Cardiac Risk Stratification   Activity Barriers Joint Problems;Balance Concerns;Muscular Weakness;Deconditioning    Cardiac Risk Stratification High             6 Minute Walk:  6 Minute Walk     Row Name 09/21/21 1036 01/24/22 1753       6 Minute Walk   Phase Initial Discharge    Distance 700 feet 800 feet    Distance % Change -- 14.3 %    Distance Feet Change -- 100 ft    Walk Time 5.83 minutes 3.82 minutes    # of Rest Breaks 1 0    MPH  1.36 2.38    METS 2.39 2.85    RPE 11 9    Perceived Dyspnea  0 0    VO2 Peak 8.36 9.96    Symptoms No Yes (comment)    Comments -- Felt sick to stomach and had to stop    Resting HR 76 bpm 103 bpm    Resting BP 84/58 112/68    Resting Oxygen Saturation  100 % 98 %    Exercise Oxygen Saturation  during 6 min walk 100 % 96 %    Max Ex. HR 90 bpm 107 bpm    Max Ex. BP 96/60 118/86    2 Minute Post BP 82/54 --             Oxygen Initial Assessment:   Oxygen Re-Evaluation:   Oxygen Discharge (Final Oxygen Re-Evaluation):   Initial Exercise Prescription:  Initial Exercise Prescription - 09/21/21 1100       Date of Initial Exercise RX and Referring Provider   Date 09/21/21    Referring Provider  Byku      Oxygen   Maintain Oxygen Saturation 88% or higher      Treadmill   MPH 1.3    Grade 0    Minutes 15    METs 2      NuStep   Level 1    SPM 80    Minutes 15    METs 2.39      REL-XR   Level 1    Minutes 15    METs 2.39      Biostep-RELP   Level 1    SPM 60    Minutes 15    METs 2.39      Track   Laps 20    Minutes 15    METs 2.09      Prescription Details   Frequency (times per week) 3    Duration Progress to 30 minutes of continuous aerobic without signs/symptoms of physical distress      Intensity   THRR 40-80% of Max Heartrate 108-140    Ratings of Perceived Exertion 11-15    Perceived Dyspnea 0-4      Progression   Progression Continue to progress workloads to maintain intensity without signs/symptoms of physical distress.      Resistance Training   Training Prescription Yes    Weight 2 lb    Reps 10-15             Perform Capillary Blood Glucose checks as needed.  Exercise Prescription Changes:   Exercise Prescription Changes     Row Name 09/21/21 1100 10/04/21 1400 10/20/21 0800 11/15/21 1700 11/29/21 1500     Response to Exercise   Blood Pressure (Admit) 84/58 108/64 104/64 98/56 102/54   Blood Pressure (Exercise)  96/60 102/56 112/60 -- --   Blood Pressure (Exit) 82/54 100/60 1_0   Heart Rate (Admit) 76 bpm 76 bpm 68 bpm 68 bpm 78 bpm   Heart Rate (Exercise) 90 bpm 93 bpm 97 bpm 104 bpm 106 bpm   Heart Rate (Exit) 74 bpm 81 bpm 75 bpm 88 bpm 76 bpm   Oxygen Saturation (Admit) 100 % -- -- -- --   Oxygen Saturation (Exercise) 100 % -- -- -- --   Oxygen Saturation (Exit) 100 % -- -- -- --   Rating of Perceived Exertion (Exercise) _1 Perceived Dyspnea (Exercise) 0 -- -- -- --   Symptoms _2    Comments 6MWT results third full day of exercise -- -- --   Duration Progress to 30 minutes of  aerobic without signs/symptoms of physical distress Progress to 30 minutes of  aerobic without signs/symptoms of physical distress Progress to 30 minutes of  aerobic without signs/symptoms of physical distress Progress to 30 minutes of  aerobic without signs/symptoms of physical distress Continue with 30 min of aerobic exercise without signs/symptoms of physical distress.   Intensity _3      Progression   Progression -- Continue to progress workloads to maintain intensity without signs/symptoms of physical distress. Continue to progress workloads to maintain intensity without signs/symptoms of physical distress. Continue to progress workloads to maintain intensity without signs/symptoms of physical distress. Continue to progress workloads to maintain intensity without signs/symptoms of physical distress.   Average METs -- 1.6 2.4 1.83 1.88     Resistance Training   Training Prescription -- Yes Yes Yes Yes   Weight -- 2 lb 2 lb  2 lb 2 lb   Reps -- 10-15 10-15 10-15 10-15     Interval Training   Interval Training -- No No No No     NuStep   Level -- 1 -- 1 --   Minutes -- 15 -- 15 --   METs -- 2 -- 2 --     REL-XR   Level -- _0 Minutes -- _1 METs -- 1.1 3 1.5 2     Track    Laps -- 20 20 -- 14  18 in the hallway   Minutes -- 15 15 -- 15   METs -- 1.8 1.8 -- 1.76     Oxygen   Maintain Oxygen Saturation -- 88% or higher 88% or higher 88% or higher 88% or higher    Row Name 12/13/21 1400 12/16/21 1400 12/27/21 1400 01/10/22 1400 01/24/22 1100     Response to Exercise   Blood Pressure (Admit) 112/58 -- 112/62 112/62 112/60   Blood Pressure (Exit) 98/58 -- 96/58 104/56 102/62   Heart Rate (Admit) 72 bpm -- 79 bpm 88 bpm 79 bpm   Heart Rate (Exercise) 119 bpm -- 103 bpm 115 bpm 102 bpm   Heart Rate (Exit) 89 bpm -- 80 bpm 99 bpm 90 bpm   Rating of Perceived Exertion (Exercise) 13 -- _2 Symptoms none -- none none none   Duration Continue with 30 min of aerobic exercise without signs/symptoms of physical distress. -- Continue with 30 min of aerobic exercise without signs/symptoms of physical distress. Continue with 30 min of aerobic exercise without signs/symptoms of physical distress. Continue with 30 min of aerobic exercise without signs/symptoms of physical distress.   Intensity THRR unchanged -- THRR unchanged THRR unchanged THRR unchanged     Progression   Progression Continue to progress workloads to maintain intensity without signs/symptoms of physical distress. -- Continue to progress workloads to maintain intensity without signs/symptoms of physical distress. Continue to progress workloads to maintain intensity without signs/symptoms of physical distress. Continue to progress workloads to maintain intensity without signs/symptoms of physical distress.   Average METs 1.94 -- 2.29 1.57 2.82     Resistance Training   Training Prescription Yes -- Yes Yes Yes   Weight 2 lb -- 2 lb 2 lb 2 lb   Reps 10-15 -- 10-15 10-15 10-15     Interval Training   Interval Training No -- No No No     NuStep   Level 2 -- _3 Minutes 15 -- _4 METs 2 -- 3 1.7 2     REL-XR   Level 1 -- 1 -- --   Minutes 15 -- 15 -- --   METs -- -- 3 -- --     Track    Laps 20  Hallway -- 22  hallway = 18 laps regular track 10  Hallway 51  hallway;  = 42.5 track   Minutes 15 -- _5 METs 1.87 -- 1.98 1.44 3.28     Home Exercise Plan   Plans to continue exercise at -- Home (comment)  walking, stretching Home (comment)  walking, stretching Home (comment)  walking, stretching Home (comment)  walking, stretching   Frequency -- Add 2 additional days to program exercise sessions. Add 2 additional days to program exercise sessions. Add 2 additional days to program exercise sessions. Add 2 additional days to  program exercise sessions.   Initial Home Exercises Provided -- 12/16/21 12/16/21 12/16/21 12/16/21     Oxygen   Maintain Oxygen Saturation 88% or higher 88% or higher 88% or higher 88% or higher 88% or higher            Exercise Comments:   Exercise Goals and Review:   Exercise Goals     Row Name 09/21/21 1336             Exercise Goals   Increase Physical Activity Yes       Intervention Provide advice, education, support and counseling about physical activity/exercise needs.;Develop an individualized exercise prescription for aerobic and resistive training based on initial evaluation findings, risk stratification, comorbidities and participant's personal goals.       Expected Outcomes Short Term: Attend rehab on a regular basis to increase amount of physical activity.;Long Term: Add in home exercise to make exercise part of routine and to increase amount of physical activity.;Long Term: Exercising regularly at least 3-5 days a week.       Increase Strength and Stamina Yes       Intervention Provide advice, education, support and counseling about physical activity/exercise needs.;Develop an individualized exercise prescription for aerobic and resistive training based on initial evaluation findings, risk stratification, comorbidities and participant's personal goals.       Expected Outcomes Short Term: Increase workloads from initial  exercise prescription for resistance, speed, and METs.;Short Term: Perform resistance training exercises routinely during rehab and add in resistance training at home;Long Term: Improve cardiorespiratory fitness, muscular endurance and strength as measured by increased METs and functional capacity (6MWT)       Able to understand and use rate of perceived exertion (RPE) scale Yes       Intervention Provide education and explanation on how to use RPE scale       Expected Outcomes Short Term: Able to use RPE daily in rehab to express subjective intensity level;Long Term:  Able to use RPE to guide intensity level when exercising independently       Able to understand and use Dyspnea scale Yes       Intervention Provide education and explanation on how to use Dyspnea scale       Expected Outcomes Short Term: Able to use Dyspnea scale daily in rehab to express subjective sense of shortness of breath during exertion;Long Term: Able to use Dyspnea scale to guide intensity level when exercising independently       Knowledge and understanding of Target Heart Rate Range (THRR) Yes       Intervention Provide education and explanation of THRR including how the numbers were predicted and where they are located for reference       Expected Outcomes Short Term: Able to state/look up THRR;Short Term: Able to use daily as guideline for intensity in rehab;Long Term: Able to use THRR to govern intensity when exercising independently       Able to check pulse independently Yes       Intervention Review the importance of being able to check your own pulse for safety during independent exercise;Provide education and demonstration on how to check pulse in carotid and radial arteries.       Expected Outcomes Short Term: Able to explain why pulse checking is important during independent exercise;Long Term: Able to check pulse independently and accurately       Understanding of Exercise Prescription Yes       Intervention  Provide education, explanation,  and written materials on patient's individual exercise prescription       Expected Outcomes Long Term: Able to explain home exercise prescription to exercise independently;Short Term: Able to explain program exercise prescription                Exercise Goals Re-Evaluation :  Exercise Goals Re-Evaluation     Row Name 09/23/21 1739 10/04/21 1420 10/20/21 0806 11/02/21 1544 11/15/21 1729     Exercise Goal Re-Evaluation   Exercise Goals Review Increase Physical Activity;Able to understand and use rate of perceived exertion (RPE) scale;Knowledge and understanding of Target Heart Rate Range (THRR);Understanding of Exercise Prescription;Increase Strength and Stamina;Able to check pulse independently Increase Physical Activity;Increase Strength and Stamina;Understanding of Exercise Prescription Increase Physical Activity;Increase Strength and Stamina;Understanding of Exercise Prescription Increase Physical Activity;Increase Strength and Stamina;Understanding of Exercise Prescription Increase Physical Activity;Increase Strength and Stamina;Understanding of Exercise Prescription   Comments Reviewed RPE and dyspnea scales, THR and program prescription with pt today.  Pt voiced understanding and was given a copy of goals to take home. Amiera is off to a good start in rehab.  She has completed her first three full days of exercise thus far.  She is already up to 2 METs on the NuStep. We will continue monitor her progress. Lyndsy is doing well in rehab. She has consistently walked 20 hallway laps for around 10 min. She also has done well with level 1 on the XR. We will continue to monitor her progress in the program. Prerna has not been here since last review. She has called out several times due to work as well as having problems with low BP. She was encouraged to call her doctor to let them know. We hope to see her attendance improve overtime. Moorea is doing well since returning  to rehab. Her attendance has still been inconsistent but she was able to show up 4 times since the last review. She has continued to tolerate level 1 on the T4 and the XR.Marland Kitchen We will continue to monitor her progress in the program.   Expected Outcomes Short: Use RPE daily to regulate intensity. Long: Follow program prescription in THR. Short: Conitnue to attend rehab regularly Long: Continue to follow program prescription Short: Conitnue to attend rehab regularly Long: Continue to follow program prescription Short: Maintain good attendence Long: Complete [facility] Program Short: Maintain good attendence Long: Complete [place] Name 11/29/21 1510 12/13/21 1413 12/16/21 1402 12/27/21 1413 01/10/22 1414     Exercise Goal Re-Evaluation   Exercise Goals Review Increase Physical Activity;Increase Strength and Stamina;Understanding of Exercise Prescription Increase Physical Activity;Increase Strength and Stamina;Understanding of Exercise Prescription Understanding of Exercise Prescription;Knowledge and understanding of Target Heart Rate Range (THRR);Able to understand and use rate of perceived exertion (RPE) scale;Increase Physical Activity;Increase Strength and Stamina;Able to understand and use Dyspnea scale;Able to check pulse independently Increase Physical Activity;Increase Strength and Stamina;Able to understand and use rate of perceived exertion (RPE) scale Increase Physical Activity;Increase Strength and Stamina;Understanding of Exercise Prescription   Comments Angie's attendance with rehab is very sporadic. Some sessions are missed and she sometimes does not get a full session in due to her work schedule leaving late. Staff discussed with her the attendance policy and how she would not benefit from her current attendance. Patient spoke with her boss who is letting her leave early so she can attend reahb in full. She was offered to come 2x/week, however, patient opted to stay with 3 and  stated she  will be more compliant with attendance. Last session she was here she walked 18 laps in the hallway. We will continue to monitor and hope to see more progression with better attendance. Janace Hoard is doing well in rehab. Her attendance was much more consistent since the last evaluation. She also increased her overall MET level to 1.94 METs. She was able to walk up to 20 laps in the hallway also. We will continue to monitor her progress in the program. Reviewed home exercise with pt today.  Pt plans to walk and do strengthening/stretches for exercise.  Reviewed THR, pulse, RPE, sign and symptoms, pulse oximetery and when to call 911 or MD.  Also discussed weather considerations and indoor options.  Pt voiced understanding. Lawanna has been better about being present on rehab days, however, she shows up 15 minutes late each time and therefore constricts her time with the exercise as she usually only has time do 1 machine. Patient comes from work and has tried to get here earlier but has not been able to. She was able to walk 22 laps in the hallway, which is equivalent to 18 on the track. She would benefit from increasing her workload to level 2 on the XR. We tried to increase her to 3 lb handweights, however, she was unable to perform the exercises appropriately as they were too heavy. Her 18 weeks will be up next month and staff already informed patient. Will continue to monitor. Carnell has only attended rehab once since the last review.  She has continued to show up late to rehab due to work, and is usually only able to use one machine. She has progressed to level 2 on the T4 but was only able to walk 10 laps in the hallway. We will continue to monitor her progress in the program.   Expected Outcomes Short: Increase level on T4 and XR Long: Increase overall MET level Short: Increase level on T4 and XR Long: Continue to increase strength and stamina. Short: Add on 1 day of exercise at home, check HR Long:  Continue to exercise independently Short: Increase to level 2 on XR Long: Continue to increase overall MET level Short: Continue to push for more laps while walking. Long: Continue to increase strength and stamina.    [place] Name 01/24/22 1114             Exercise Goal Re-Evaluation   Exercise Goals Review Increase Physical Activity;Increase Strength and Stamina;Understanding of Exercise Prescription       Comments Rosanne missed another week of exercise. She is due to graduate by the end of the month as she has exceeded her 18 weeks. She did however, walk the entire time she was in class and ended up getting 51 laps on the track!! that is most she has ever worked, she worked over 3.2 METS. She is due for her post 6MWT and hope to see improvement. Will continue to monitor until graduation.       Expected Outcomes Short: Improve on post 6MWT Long: Continue to increase overall MET level                Discharge Exercise Prescription (Final Exercise Prescription Changes):  Exercise Prescription Changes - 01/24/22 1100       Response to Exercise   Blood Pressure (Admit) 112/60    Blood Pressure (Exit) 102/62    Heart Rate (Admit) 79 bpm    Heart Rate (Exercise) 102 bpm    Heart Rate (Exit) 90  bpm    Rating of Perceived Exertion (Exercise) 13    Symptoms none    Duration Continue with 30 min of aerobic exercise without signs/symptoms of physical distress.    Intensity THRR unchanged      Progression   Progression Continue to progress workloads to maintain intensity without signs/symptoms of physical distress.    Average METs 2.82      Resistance Training   Training Prescription Yes    Weight 2 lb    Reps 10-15      Interval Training   Interval Training No      NuStep   Level 2    Minutes 15    METs 2      Track   Laps 51   hallway;  = 42.5 track   Minutes 15    METs 3.28      Home Exercise Plan   Plans to continue exercise at Home (comment)   walking, stretching    Frequency Add 2 additional days to program exercise sessions.    Initial Home Exercises Provided 12/16/21      Oxygen   Maintain Oxygen Saturation 88% or higher             Nutrition:  Target Goals: Understanding of nutrition guidelines, daily intake of sodium <1561m, cholesterol <2034m calories 30% from fat and 7% or less from saturated fats, daily to have 5 or more servings of fruits and vegetables.  Education: All About Nutrition: -Group instruction provided by verbal, written material, interactive activities, discussions, models, and posters to present general guidelines for heart healthy nutrition including fat, fiber, MyPlate, the role of sodium in heart healthy nutrition, utilization of the nutrition label, and utilization of this knowledge for meal planning. Follow up email sent as well. Written material given at graduation.   Biometrics:  Pre Biometrics - 09/21/21 1038       Pre Biometrics   Height _0  (1.651 m)    Weight 101 lb 11.2 oz (46.1 kg)    BMI (Calculated) 16.92    Single Leg Stand 3.8 seconds             Post Biometrics - 01/24/22 1756        Post  Biometrics   Height _1  (1.651 m)    Weight 103 lb 1.6 oz (46.8 kg)    BMI (Calculated) 17.16    Single Leg Stand 1.8 seconds             Nutrition Therapy Plan and Nutrition Goals:  Nutrition Therapy & Goals - 12/02/21 1302       Nutrition Therapy   Diet High calorie, high protein    Protein (specify units) 80-90g    Fiber 21 grams    Whole Grain Foods 1 servings   Pt has low appetite   Saturated Fats 17 max. grams   meeting protein/calorie needs more important   Fruits and Vegetables 5 servings/day   Pt has low appetite   Sodium 2 grams   meeting protein/calorie needs more important     Personal Nutrition Goals   Nutrition Goal ST: try nutritional shake, drink in between meals, add protein and fat to meals/snacks, eat more frequent meals LT: meet calorie and protein needs     Comments [age].o. F admitted to cardiac rehab for CHF. PMHx includes HTN, gout, afib, breast cancer s/p mastectomy and adriamycin chemotherapy and non-ischemic cardiomyopathy (EF 20-25% with recovery to 40-45% now severely reduced 10-15%). Relevant medications  includes vit D3, ferrous sulfate, folic acid, furosemide, hydrocodone, jardiance, imodium, mag-ox, K+, Vit B12. PYP Score: 52. Vegetables & Fruits 8/12. Breads, Grains & Cereals 6/12. Red & Processed Meat 5/12. Poultry 0/2. Fish & Shellfish 2/4. Beans, Nuts & Seeds 2/4. Milk & Dairy Foods 3/6. Toppings, Oils, Seasonings & Salt 9/20. Sweets, Snacks & Restaurant Food 7/14. Beverages 10/10.  She will not eat normally until 1pm and the last time she eats is about 8pm. B: OJ, jello cups or cereal (2% milk, pops) L: sandwich (white bread with mayo and deli meat like bolonga) S: jello pack or sliced apples with PB butter D: chicken salad on crossaint with lays potato chips. Drinks: water. Angie reports that her MD gave her a medication to help with appetite at the beginning of the year, but she no longer takes them as her appetite is back to normal. Angie would like to gain weight back. Discussed high calorie/high protein MNT; adding beans to grains, greek yogurt and butter to potatoes, oil to vegetables, peanut butter, etc.  Reviewed general heart healthy eating.      Intervention Plan   Intervention Prescribe, educate and counsel regarding individualized specific dietary modifications aiming towards targeted core components such as weight, hypertension, lipid management, diabetes, heart failure and other comorbidities.    Expected Outcomes Short Term Goal: Understand basic principles of dietary content, such as calories, fat, sodium, cholesterol and nutrients.;Long Term Goal: Adherence to prescribed nutrition plan.;Short Term Goal: A plan has been developed with personal nutrition goals set during dietitian appointment.             Nutrition  Assessments:  MEDIFICTS Score Key: ?70 Need to make dietary changes  40-70 Heart Healthy Diet ? 40 Therapeutic Level Cholesterol Diet  Flowsheet Row Cardiac Rehab from 09/21/2021 in [facility] Card[facility]  Picture Your Plate Total Score on Admission 52      Picture Your Plate Scores: <11 Unhealthy dietary pattern with much room for improvement. 41-50 Dietary pattern unlikely to meet recommendations for good health and room for improvement. 51-60 More healthful dietary pattern, with some room for improvement.  >60 Healthy dietary pattern, although there may be some specific behaviors that could be improved.    Nutrition Goals Re-Evaluation:  Nutrition Goals Re-Evaluation     [place] Name 11/04/21 1747 11/25/21 1753 12/22/21 1739         Goals   Current Weight 98 lb (44.5 kg) 98 lb (44.5 kg) 100 lb (45.4 kg)     Nutrition Goal Gain weight Meet with the dietitian gain more weight     Comment Informed patient of some foods that could help her gain weight. Gave her an option of protien without dairy. (gave patient a printout of product) She is trying to eat more but needs some help intaking more calories. Patient would like to meet with the dietictan and try to gain weight. Angie has been able to put on a couple more pounds. She is drinking more protien shakes and states that her appetite has been better.     Expected Outcome Short: try to drink protien shakes routinely. Long: gain weight. Short: meet with the dietitian.Long: eat a diet that adheres to her needs. Short: gain a few more pounds in the next couple weeks. Long: maintain weight gain independently              Nutrition Goals Discharge (Final Nutrition Goals Re-Evaluation):  Nutrition Goals Re-Evaluation - 12/22/21 1739  Goals   Current Weight 100 lb (45.4 kg)    Nutrition Goal gain more weight    Comment Angie has been able to put on a couple more pounds. She is drinking more protien shakes and states  that her appetite has been better.    Expected Outcome Short: gain a few more pounds in the next couple weeks. Long: maintain weight gain independently             Psychosocial: Target Goals: Acknowledge presence or absence of significant depression and/or stress, maximize coping skills, provide positive support system. Participant is able to verbalize types and ability to use techniques and skills needed for reducing stress and depression.   Education: Stress, Anxiety, and Depression - Group verbal and visual presentation to define topics covered.  Reviews how body is impacted by stress, anxiety, and depression.  Also discusses healthy ways to reduce stress and to treat/manage anxiety and depression.  Written material given at graduation.   Education: Sleep Hygiene -Provides group verbal and written instruction about how sleep can affect your health.  Define sleep hygiene, discuss sleep cycles and impact of sleep habits. Review good sleep hygiene tips.    Initial Review & Psychosocial Screening:  Initial Psych Review & Screening - 09/15/21 1456       Initial Review   Current issues with Current Stress Concerns    Source of Stress Concerns Chronic Illness    Comments Jeraldean reports stress from trying to decide what her next steps are medically: transplant or LVAD. She relies on her husband and a couple of sister who live close to support her. She is still working and would like to work around her schedule to attend cardiac rehab; she would like to increase her strength and balance as well.  She reports no sleep issues at this time. She likes to shop, cooking, and going to the beach to help to relieve stress.      Family Dynamics   Good Support System? Yes   husband, sister     Barriers   Psychosocial barriers to participate in program The patient should benefit from training in stress management and relaxation.;Psychosocial barriers identified (see note)      Screening  Interventions   Interventions Encouraged to exercise;Provide feedback about the scores to participant;To provide support and resources with identified psychosocial needs    Expected Outcomes Short Term goal: Utilizing psychosocial counselor, staff and physician to assist with identification of specific Stressors or current issues interfering with healing process. Setting desired goal for each stressor or current issue identified.;Long Term Goal: Stressors or current issues are controlled or eliminated.;Short Term goal: Identification and review with participant of any Quality of Life or Depression concerns found by scoring the questionnaire.;Long Term goal: The participant improves quality of Life and PHQ9 Scores as seen by post scores and/or verbalization of changes             Quality of Life Scores:   Quality of Life - 09/21/21 1344       Quality of Life   Select Quality of Life      Quality of Life Scores   Health/Function Pre 26.6 %    Socioeconomic Pre 28.75 %    Psych/Spiritual Pre 29.14 %    Family Pre 26.8 %    GLOBAL Pre 27.63 %            Scores of 19 and below usually indicate a poorer quality of life in these areas.  A difference of  2-3 points is a clinically meaningful difference.  A difference of 2-3 points in the total score of the Quality of Life Index has been associated with significant improvement in overall quality of life, self-image, physical symptoms, and general health in studies assessing change in quality of life.  PHQ-9: Review Flowsheet       09/21/2021 12/07/2017  Depression screen PHQ 2/9  Decreased Interest 0 0  Down, Depressed, Hopeless 0 0  PHQ - 2 Score 0 0  Altered sleeping 0 0  Tired, decreased energy 0 0  Change in appetite 0 0  Feeling bad or failure about yourself  0 0  Trouble concentrating 0 0  Moving slowly or fidgety/restless 0 0  Suicidal thoughts 0 0  PHQ-9 Score 0 0  Difficult doing work/chores Not difficult at all -    Interpretation of Total Score  Total Score Depression Severity:  1-4 = Minimal depression, 5-9 = Mild depression, 10-14 = Moderate depression, 15-19 = Moderately severe depression, 20-27 = Severe depression   Psychosocial Evaluation and Intervention:  Psychosocial Evaluation - 09/15/21 1502       Psychosocial Evaluation & Interventions   Interventions Stress management education;Relaxation education;Encouraged to exercise with the program and follow exercise prescription    Comments Kloi reports stress from trying to decide what her next steps are medically: transplant or LVAD. She relies on her husband and a couple of sister who live close to support her. She is still working and would like to work around her schedule to attend cardiac rehab; she would like to increase her strength and balance as well.  She reports no sleep issues at this time. She likes to shop, cooking, and going to the beach to help to relieve stress.    Expected Outcomes ST: attend all scheduled cardiac rehab exercise/education sessions, progess with exercise prescription LT: improve strength and progress with exercise prescription independently    Continue Psychosocial Services  Follow up required by staff             Psychosocial Re-Evaluation:  Psychosocial Re-Evaluation     [place] Name 11/04/21 1748 11/25/21 1751 12/22/21 1740         Psychosocial Re-Evaluation   Current issues with None Identified None Identified None Identified     Comments Patient reports no issues with their current mental states, sleep, stress, depression or anxiety. Will follow up with patient in a few weeks for any changes. Dareth states that she has been thinking about being more healthy. She states that she is more determined to workout and get stronger. She has gone to the beach last week and had a relaxing time with her husband. Birdell states that she has had more energy since she has been able to eat more. She states no depresson  or anxiety and does not take anything for her mood.     Expected Outcomes Short: Continue to exercise regularly to support mental health and notify staff of any changes. Long: maintain mental health and well being through teaching of rehab or prescribed medications independently. Short: attend [facility]track to keep stress at a minimum. Long: maintain exercise and reduce stressors independently. Short: attend hearttrack to keep stress at a minimum. Long: maintain exercise and reduce stressors independently.     Interventions Encouraged to attend Cardiac Rehabilitation for the exercise Encouraged to attend Cardiac Rehabilitation for the exercise Encouraged to attend Cardiac Rehabilitation for the exercise     Continue Psychosocial Services  Follow up  required by staff Follow up required by staff Follow up required by staff              Psychosocial Discharge (Final Psychosocial Re-Evaluation):  Psychosocial Re-Evaluation - 12/22/21 1740       Psychosocial Re-Evaluation   Current issues with None Identified    Comments Marcey states that she has had more energy since she has been able to eat more. She states no depresson or anxiety and does not take anything for her mood.    Expected Outcomes Short: attend hearttrack to keep stress at a minimum. Long: maintain exercise and reduce stressors independently.    Interventions Encouraged to attend Cardiac Rehabilitation for the exercise    Continue Psychosocial Services  Follow up required by staff             Vocational Rehabilitation: Provide vocational rehab assistance to qualifying candidates.   Vocational Rehab Evaluation & Intervention:  Vocational Rehab - 09/15/21 1455       Initial Vocational Rehab Evaluation & Intervention   Assessment shows need for Vocational Rehabilitation No      Vocational Rehab Re-Evaulation   Comments She is still working.             Education: Education Goals: Education classes will be provided  on a variety of topics geared toward better understanding of heart health and risk factor modification. Participant will state understanding/return demonstration of topics presented as noted by education test scores.  Learning Barriers/Preferences:  Learning Barriers/Preferences - 09/15/21 1451       Learning Barriers/Preferences   Learning Barriers None    Learning Preferences None             General Cardiac Education Topics:  AED/CPR: - Group verbal and written instruction with the use of models to demonstrate the basic use of the AED with the basic ABC's of resuscitation.   Anatomy and Cardiac Procedures: - Group verbal and visual presentation and models provide information about basic cardiac anatomy and function. Reviews the testing methods done to diagnose heart disease and the outcomes of the test results. Describes the treatment choices: Medical Management, Angioplasty, or Coronary Bypass Surgery for treating various heart conditions including Myocardial Infarction, Angina, Valve Disease, and Cardiac Arrhythmias.  Written material given at graduation.   Medication Safety: - Group verbal and visual instruction to review commonly prescribed medications for heart and lung disease. Reviews the medication, class of the drug, and side effects. Includes the steps to properly store meds and maintain the prescription regimen.  Written material given at graduation.   Intimacy: - Group verbal instruction through game format to discuss how heart and lung disease can affect sexual intimacy. Written material given at graduation..   Know Your Numbers and Heart Failure: - Group verbal and visual instruction to discuss disease risk factors for cardiac and pulmonary disease and treatment options.  Reviews associated critical values for Overweight/Obesity, Hypertension, Cholesterol, and Diabetes.  Discusses basics of heart failure: signs/symptoms and treatments.  Introduces Heart Failure  Zone chart for action plan for heart failure.  Written material given at graduation.   Infection Prevention: - Provides verbal and written material to individual with discussion of infection control including proper hand washing and proper equipment cleaning during exercise session. Flowsheet Row Cardiac Rehab from 09/21/2021 in [facility] Cardiac and Pulmonary Rehab  Education need identified 09/21/21  Date 09/21/21  Educator NT  Instruction Review Code 1- Verbalizes Understanding       Falls Prevention: - Provides  verbal and written material to individual with discussion of falls prevention and safety. Flowsheet Row Cardiac Rehab from 09/21/2021 in [facility] Cardiac and Pulmonary Rehab  Education need identified 09/15/21  Date 09/15/21  Educator [place]  Instruction Review Code 1- Verbalizes Understanding       Other: -Provides group and verbal instruction on various topics (see comments)   Knowledge Questionnaire Score:  Knowledge Questionnaire Score - 09/21/21 1340       Knowledge Questionnaire Score   Pre Score 23/26             Core Components/Risk Factors/Patient Goals at Admission:  Personal Goals and Risk Factors at Admission - 09/21/21 1351       Core Components/Risk Factors/Patient Goals on Admission    Weight Management Weight Gain;Yes    Intervention Weight Management: Develop a combined nutrition and exercise program designed to reach desired caloric intake, while maintaining appropriate intake of nutrient and fiber, sodium and fats, and appropriate energy expenditure required for the weight goal.;Weight Management: Provide education and appropriate resources to help participant work on and attain dietary goals.    Admit Weight 101 lb 11.2 oz (46.1 kg)    Goal Weight: Short Term 105 lb (47.6 kg)    Goal Weight: Long Term 110 lb (49.9 kg)    Expected Outcomes Short Term: Continue to assess and modify interventions until short term weight is achieved;Long Term: Adherence  to nutrition and physical activity/exercise program aimed toward attainment of established weight goal;Weight Gain: Understanding of general recommendations for a high calorie, high protein meal plan that promotes weight gain by distributing calorie intake throughout the day with the consumption for 4-5 meals, snacks, and/or supplements;Understanding of distribution of calorie intake throughout the day with the consumption of 4-5 meals/snacks    Heart Failure Yes    Intervention Provide a combined exercise and nutrition program that is supplemented with education, support and counseling about heart failure. Directed toward relieving symptoms such as shortness of breath, decreased exercise tolerance, and extremity edema.    Expected Outcomes Short term: Attendance in program 2-3 days a week with increased exercise capacity. Reported lower sodium intake. Reported increased fruit and vegetable intake. Reports medication compliance.;Improve functional capacity of life;Short term: Daily weights obtained and reported for increase. Utilizing diuretic protocols set by physician.;Long term: Adoption of self-care skills and reduction of barriers for early signs and symptoms recognition and intervention leading to self-care maintenance.    Hypertension Yes    Intervention Provide education on lifestyle modifcations including regular physical activity/exercise, weight management, moderate sodium restriction and increased consumption of fresh fruit, vegetables, and low fat dairy, alcohol moderation, and smoking cessation.;Monitor prescription use compliance.    Expected Outcomes Short Term: Continued assessment and intervention until BP is < 140/42m HG in hypertensive participants. < 130/875mHG in hypertensive participants with diabetes, heart failure or chronic kidney disease.;Long Term: Maintenance of blood pressure at goal levels.             Education:Diabetes - Individual verbal and written instruction to  review signs/symptoms of diabetes, desired ranges of glucose level fasting, after meals and with exercise. Acknowledge that pre and post exercise glucose checks will be done for 3 sessions at entry of program.   Core Components/Risk Factors/Patient Goals Review:   Goals and Risk Factor Review     Row Name 11/04/21 1737 11/25/21 1748 12/22/21 1733         Core Components/Risk Factors/Patient Goals Review   Personal Goals Review Weight Management/Obesity  Weight Management/Obesity Weight Management/Obesity     Review Lucerito wants to gain weight and has since lost weight since the begining of the program. She is trying to eat more to gain weight. Recommended some protien powder and ways to intake more calories. Jestina wants to gain weight and has not gained weight in the last 3 weeks. Her appetite is hit or miss somedays. She states that she eating ice cream, cookies and candy. She knows she needs to eat more and a better diet. Angie has gone up two pounds in the last couple weeks. She has been trying to intake more calories to help her gain more weight.     Expected Outcomes Short: gain some weight. Long Reach weight goal. Short: try to gain weight in the next few weeks. Long: gain more weight. Short: try to gain another two pounds. Long: reach her weight goal.              Core Components/Risk Factors/Patient Goals at Discharge (Final Review):   Goals and Risk Factor Review - 12/22/21 1733       Core Components/Risk Factors/Patient Goals Review   Personal Goals Review Weight Management/Obesity    Review Angie has gone up two pounds in the last couple weeks. She has been trying to intake more calories to help her gain more weight.    Expected Outcomes Short: try to gain another two pounds. Long: reach her weight goal.             ITP Comments:  ITP Comments     Row Name 09/15/21 1511 09/21/21 1035 09/23/21 1738 10/13/21 1343 11/04/21 1003   ITP Comments Virtual orientation call  completed today. She has an appointment on Date: 09/21/21  for EP eval and gym Orientation.  Documentation of diagnosis can be found in [facility] Date: 08/18/21 . Completed 6MWT and gym orientation. Initial ITP created and sent for review to Dr. Emily Filbert, Medical Director. First full day of exercise!  Patient was oriented to gym and equipment including functions, settings, policies, and procedures.  Patient's individual exercise prescription and treatment plan were reviewed.  All starting workloads were established based on the results of the 6 minute walk test done at initial orientation visit.  The plan for exercise progression was also introduced and progression will be customized based on patient's performance and goals. 30 Day review completed. Medical Director ITP review done, changes made as directed, and signed approval by Medical Director.   NEW Called pt to check in as she has only been to rehab 2x this past month; she reports she will be at rehab this afternoon.    Row Name 11/10/21 0751 12/02/21 1400 12/08/21 1023 01/05/22 0851 02/02/22 1114   ITP Comments 30 Day review completed. Medical Director ITP review done, changes made as directed, and signed approval by Medical Director. Completed initial RD consultation 30 Day review completed. Medical Director ITP review done, changes made as directed, and signed approval by Medical Director. 30 Day review completed. Medical Director ITP review done, changes made as directed, and signed approval by Medical Director. 30 Day review completed. Medical Director ITP review done, changes made as directed, and signed approval by Medical Director.    [place] Name 02/03/22 1733           ITP Comments Sharlett graduated today from  rehab with 31 sessions completed.  Details of the patient's exercise prescription and what She needs to do in order to continue the prescription and progress  were discussed with patient.  Patient was given a copy of prescription and goals.   Patient verbalized understanding.  Javonna plans to continue to exercise by walking.                Comments: Discharge ITP Will bring paperwork next week, will send updated ITP once paperwork entered

## 2023-04-05 ENCOUNTER — Ambulatory Visit: Payer: 59 | Admitting: Physical Therapy

## 2023-04-12 ENCOUNTER — Ambulatory Visit: Payer: 59 | Attending: *Deleted | Admitting: Physical Therapy

## 2023-04-12 ENCOUNTER — Other Ambulatory Visit: Payer: Self-pay

## 2023-04-12 ENCOUNTER — Encounter: Payer: Self-pay | Admitting: Physical Therapy

## 2023-04-12 DIAGNOSIS — M6281 Muscle weakness (generalized): Secondary | ICD-10-CM

## 2023-04-12 DIAGNOSIS — M5459 Other low back pain: Secondary | ICD-10-CM

## 2023-04-12 DIAGNOSIS — M542 Cervicalgia: Secondary | ICD-10-CM | POA: Diagnosis present

## 2023-04-12 DIAGNOSIS — M546 Pain in thoracic spine: Secondary | ICD-10-CM
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
## 2023-04-19 ENCOUNTER — Ambulatory Visit: Payer: Self-pay | Admitting: Physical Therapy

## 2023-04-19 DIAGNOSIS — M542 Cervicalgia: Secondary | ICD-10-CM | POA: Diagnosis not present

## 2023-04-19 DIAGNOSIS — M5459 Other low back pain: Secondary | ICD-10-CM

## 2023-04-19 DIAGNOSIS — M6281 Muscle weakness (generalized): Secondary | ICD-10-CM

## 2023-04-19 DIAGNOSIS — M546 Pain in thoracic spine: Secondary | ICD-10-CM
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
## 2023-04-26 ENCOUNTER — Ambulatory Visit: Payer: Self-pay | Admitting: Physical Therapy

## 2023-04-26 DIAGNOSIS — M6281 Muscle weakness (generalized): Secondary | ICD-10-CM

## 2023-04-26 DIAGNOSIS — M542 Cervicalgia: Secondary | ICD-10-CM

## 2023-04-26 DIAGNOSIS — M5459 Other low back pain: Secondary | ICD-10-CM

## 2023-04-26 DIAGNOSIS — M546 Pain in thoracic spine: Secondary | ICD-10-CM
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
## 2023-05-03 ENCOUNTER — Ambulatory Visit: Payer: Self-pay | Admitting: Physical Therapy

## 2023-05-03 DIAGNOSIS — M546 Pain in thoracic spine: Secondary | ICD-10-CM

## 2023-05-03 DIAGNOSIS — M542 Cervicalgia: Secondary | ICD-10-CM

## 2023-05-03 DIAGNOSIS — M5459 Other low back pain: Secondary | ICD-10-CM

## 2023-05-03 DIAGNOSIS — M6281 Muscle weakness (generalized): Secondary | ICD-10-CM
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
## 2023-05-10 ENCOUNTER — Ambulatory Visit: Payer: Self-pay | Attending: *Deleted | Admitting: Physical Therapy

## 2023-05-10 DIAGNOSIS — M5459 Other low back pain: Secondary | ICD-10-CM | POA: Diagnosis present

## 2023-05-10 DIAGNOSIS — M546 Pain in thoracic spine: Secondary | ICD-10-CM

## 2023-05-10 DIAGNOSIS — M6281 Muscle weakness (generalized): Secondary | ICD-10-CM | POA: Diagnosis present

## 2023-05-10 DIAGNOSIS — M542 Cervicalgia: Secondary | ICD-10-CM
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
## 2023-05-16 ENCOUNTER — Ambulatory Visit: Payer: Self-pay | Admitting: Physical Therapy

## 2023-05-16 DIAGNOSIS — M6281 Muscle weakness (generalized): Secondary | ICD-10-CM

## 2023-05-16 DIAGNOSIS — M546 Pain in thoracic spine: Secondary | ICD-10-CM

## 2023-05-16 DIAGNOSIS — M5459 Other low back pain: Secondary | ICD-10-CM

## 2023-05-16 DIAGNOSIS — M542 Cervicalgia: Secondary | ICD-10-CM | POA: Diagnosis not present
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
## 2023-05-23 ENCOUNTER — Ambulatory Visit: Payer: Self-pay | Admitting: Physical Therapy

## 2023-05-25 ENCOUNTER — Ambulatory Visit: Payer: Self-pay

## 2023-05-25 DIAGNOSIS — M542 Cervicalgia: Secondary | ICD-10-CM | POA: Diagnosis not present

## 2023-05-25 DIAGNOSIS — M546 Pain in thoracic spine: Secondary | ICD-10-CM

## 2023-05-25 DIAGNOSIS — M5459 Other low back pain: Secondary | ICD-10-CM

## 2023-05-25 DIAGNOSIS — M6281 Muscle weakness (generalized): Secondary | ICD-10-CM
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
## 2023-05-30 ENCOUNTER — Ambulatory Visit: Payer: Self-pay | Admitting: Physical Therapy

## 2023-05-30 DIAGNOSIS — M5459 Other low back pain: Secondary | ICD-10-CM

## 2023-05-30 DIAGNOSIS — M542 Cervicalgia: Secondary | ICD-10-CM

## 2023-05-30 DIAGNOSIS — M546 Pain in thoracic spine: Secondary | ICD-10-CM

## 2023-05-30 DIAGNOSIS — M6281 Muscle weakness (generalized): Secondary | ICD-10-CM
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
## 2023-06-01 ENCOUNTER — Ambulatory Visit: Payer: Self-pay | Admitting: Physical Therapy

## 2023-06-06 ENCOUNTER — Ambulatory Visit: Payer: Self-pay | Admitting: Physical Therapy

## 2023-06-06 DIAGNOSIS — M546 Pain in thoracic spine: Secondary | ICD-10-CM

## 2023-06-06 DIAGNOSIS — M5459 Other low back pain: Secondary | ICD-10-CM

## 2023-06-06 DIAGNOSIS — M542 Cervicalgia: Secondary | ICD-10-CM

## 2023-06-06 DIAGNOSIS — M6281 Muscle weakness (generalized): Secondary | ICD-10-CM
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
## 2023-06-08 ENCOUNTER — Ambulatory Visit: Payer: Self-pay | Attending: *Deleted

## 2023-06-08 DIAGNOSIS — M5459 Other low back pain: Secondary | ICD-10-CM

## 2023-06-08 DIAGNOSIS — M542 Cervicalgia: Secondary | ICD-10-CM | POA: Diagnosis present

## 2023-06-08 DIAGNOSIS — M546 Pain in thoracic spine: Secondary | ICD-10-CM | POA: Diagnosis present

## 2023-06-08 DIAGNOSIS — M6281 Muscle weakness (generalized): Secondary | ICD-10-CM
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
## 2023-06-13 ENCOUNTER — Ambulatory Visit: Payer: Self-pay | Admitting: Physical Therapy

## 2023-06-13 DIAGNOSIS — M542 Cervicalgia: Secondary | ICD-10-CM

## 2023-06-13 DIAGNOSIS — M5459 Other low back pain: Secondary | ICD-10-CM

## 2023-06-13 DIAGNOSIS — M546 Pain in thoracic spine: Secondary | ICD-10-CM

## 2023-06-13 DIAGNOSIS — M6281 Muscle weakness (generalized): Secondary | ICD-10-CM

## 2023-06-15 ENCOUNTER — Ambulatory Visit: Payer: Self-pay | Admitting: Physical Therapy

## 2023-06-15 DIAGNOSIS — M546 Pain in thoracic spine: Secondary | ICD-10-CM

## 2023-06-15 DIAGNOSIS — M6281 Muscle weakness (generalized): Secondary | ICD-10-CM

## 2023-06-15 DIAGNOSIS — M5459 Other low back pain: Secondary | ICD-10-CM

## 2023-06-15 DIAGNOSIS — M542 Cervicalgia: Secondary | ICD-10-CM | POA: Diagnosis not present
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
## 2023-06-19 ENCOUNTER — Telehealth: Payer: Self-pay

## 2023-06-19 ENCOUNTER — Ambulatory Visit: Payer: Self-pay

## 2023-06-19 NOTE — Telephone Encounter (Signed)
 Patient called due to no show. Unable to reach voicemail to leave date and time of next appointment.   Dorothy Mann  Brain Cahill, PT, DPT Physical Therapist - [facility]  Outpatient Physical Therapy- Main Campus [phone]

## 2023-06-20 ENCOUNTER — Ambulatory Visit: Payer: Self-pay | Admitting: Physical Therapy

## 2023-06-22 ENCOUNTER — Ambulatory Visit: Payer: Self-pay | Admitting: Physical Therapy

## 2023-06-22 DIAGNOSIS — M546 Pain in thoracic spine: Secondary | ICD-10-CM

## 2023-06-22 DIAGNOSIS — M6281 Muscle weakness (generalized): Secondary | ICD-10-CM

## 2023-06-22 DIAGNOSIS — M5459 Other low back pain: Secondary | ICD-10-CM

## 2023-06-22 DIAGNOSIS — M542 Cervicalgia: Secondary | ICD-10-CM
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
## 2023-06-27 ENCOUNTER — Ambulatory Visit: Payer: Self-pay | Admitting: Physical Therapy

## 2023-06-29 ENCOUNTER — Ambulatory Visit: Payer: Self-pay | Admitting: Physical Therapy

## 2023-06-29 DIAGNOSIS — M542 Cervicalgia: Secondary | ICD-10-CM | POA: Diagnosis not present

## 2023-06-29 DIAGNOSIS — M5459 Other low back pain: Secondary | ICD-10-CM

## 2023-06-29 DIAGNOSIS — M546 Pain in thoracic spine: Secondary | ICD-10-CM
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
## 2023-07-04 ENCOUNTER — Ambulatory Visit: Payer: Self-pay | Admitting: Physical Therapy

## 2023-07-04 DIAGNOSIS — M5459 Other low back pain: Secondary | ICD-10-CM

## 2023-07-04 DIAGNOSIS — M546 Pain in thoracic spine: Secondary | ICD-10-CM

## 2023-07-04 DIAGNOSIS — M542 Cervicalgia: Secondary | ICD-10-CM | POA: Diagnosis not present

## 2023-07-04 DIAGNOSIS — M6281 Muscle weakness (generalized): Secondary | ICD-10-CM
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
## 2023-07-06 ENCOUNTER — Ambulatory Visit: Payer: Self-pay

## 2023-07-06 DIAGNOSIS — M546 Pain in thoracic spine: Secondary | ICD-10-CM

## 2023-07-06 DIAGNOSIS — M542 Cervicalgia: Secondary | ICD-10-CM | POA: Diagnosis not present
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
## 2023-07-11 ENCOUNTER — Ambulatory Visit: Payer: Self-pay | Admitting: Physical Therapy
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
## 2023-07-13 ENCOUNTER — Ambulatory Visit: Payer: Self-pay | Admitting: Physical Therapy
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
## 2023-07-18 ENCOUNTER — Encounter: Payer: Self-pay | Admitting: Physical Therapy

## 2023-07-18 ENCOUNTER — Ambulatory Visit: Payer: Self-pay | Attending: *Deleted

## 2023-07-18 DIAGNOSIS — M542 Cervicalgia: Secondary | ICD-10-CM | POA: Insufficient documentation

## 2023-07-18 DIAGNOSIS — M5459 Other low back pain: Secondary | ICD-10-CM | POA: Insufficient documentation

## 2023-07-18 DIAGNOSIS — M546 Pain in thoracic spine: Secondary | ICD-10-CM | POA: Insufficient documentation

## 2023-07-18 DIAGNOSIS — M6281 Muscle weakness (generalized): Secondary | ICD-10-CM | POA: Diagnosis present
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
## 2023-07-20 ENCOUNTER — Ambulatory Visit: Payer: Self-pay | Admitting: Physical Therapy

## 2023-07-20 DIAGNOSIS — M546 Pain in thoracic spine: Secondary | ICD-10-CM

## 2023-07-20 DIAGNOSIS — M542 Cervicalgia: Secondary | ICD-10-CM | POA: Diagnosis not present

## 2023-07-20 DIAGNOSIS — M6281 Muscle weakness (generalized): Secondary | ICD-10-CM

## 2023-07-20 DIAGNOSIS — M5459 Other low back pain: Secondary | ICD-10-CM
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
## 2023-07-25 ENCOUNTER — Ambulatory Visit: Payer: Self-pay | Admitting: Physical Therapy

## 2023-07-25 DIAGNOSIS — M5459 Other low back pain: Secondary | ICD-10-CM

## 2023-07-25 DIAGNOSIS — M6281 Muscle weakness (generalized): Secondary | ICD-10-CM

## 2023-07-25 DIAGNOSIS — M542 Cervicalgia: Secondary | ICD-10-CM

## 2023-07-25 DIAGNOSIS — M546 Pain in thoracic spine: Secondary | ICD-10-CM
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
## 2023-07-27 ENCOUNTER — Ambulatory Visit: Payer: Self-pay

## 2023-07-27 DIAGNOSIS — M546 Pain in thoracic spine: Secondary | ICD-10-CM

## 2023-07-27 DIAGNOSIS — M6281 Muscle weakness (generalized): Secondary | ICD-10-CM

## 2023-07-27 DIAGNOSIS — M5459 Other low back pain: Secondary | ICD-10-CM

## 2023-07-27 DIAGNOSIS — M542 Cervicalgia: Secondary | ICD-10-CM
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
## 2023-08-01 ENCOUNTER — Telehealth: Payer: Self-pay | Admitting: Physical Therapy

## 2023-08-01 ENCOUNTER — Ambulatory Visit: Admitting: Physical Therapy

## 2023-08-01 NOTE — Telephone Encounter (Signed)
 Pt contacted via telephone and dino attempted to leave voice mail informing of missed appointment and informed pt of future PT appointment date and time, but voicemail box is full and unable to accept new messages.    Massie Dollar PT, DPT  Physical Therapist - [place]  [facility]  3:51 PM 08/01/23

## 2023-08-03 ENCOUNTER — Ambulatory Visit: Admitting: Physical Therapy

## 2023-08-03 DIAGNOSIS — M546 Pain in thoracic spine: Secondary | ICD-10-CM

## 2023-08-03 DIAGNOSIS — M5459 Other low back pain: Secondary | ICD-10-CM

## 2023-08-03 DIAGNOSIS — M542 Cervicalgia: Secondary | ICD-10-CM

## 2023-08-03 DIAGNOSIS — M6281 Muscle weakness (generalized): Secondary | ICD-10-CM
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
## 2023-08-09 ENCOUNTER — Ambulatory Visit

## 2023-08-16 ENCOUNTER — Ambulatory Visit: Attending: *Deleted

## 2023-08-16 DIAGNOSIS — M546 Pain in thoracic spine: Secondary | ICD-10-CM | POA: Insufficient documentation

## 2023-08-16 DIAGNOSIS — M6281 Muscle weakness (generalized): Secondary | ICD-10-CM | POA: Diagnosis present

## 2023-08-16 DIAGNOSIS — M542 Cervicalgia: Secondary | ICD-10-CM | POA: Insufficient documentation

## 2023-08-16 DIAGNOSIS — M5459 Other low back pain: Secondary | ICD-10-CM | POA: Diagnosis present
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
## 2023-08-22 ENCOUNTER — Ambulatory Visit: Admitting: Physical Therapy

## 2023-08-23 ENCOUNTER — Ambulatory Visit

## 2023-08-30 ENCOUNTER — Ambulatory Visit

## 2023-08-30 DIAGNOSIS — M542 Cervicalgia: Secondary | ICD-10-CM

## 2023-08-30 DIAGNOSIS — M6281 Muscle weakness (generalized): Secondary | ICD-10-CM
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
## 2023-09-05 ENCOUNTER — Ambulatory Visit

## 2023-09-05 ENCOUNTER — Telehealth: Payer: Self-pay

## 2023-09-05 NOTE — Telephone Encounter (Signed)
 Pt did not arrive for scheduled appointment. No telephone call nor message preceeded this absence. Author attempted to contact pt via telephone number listed in chart. 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 Front desk notified that pt would like any available appointments for tomorrow at 3:30 or later as that are available.   4:06 PM, 09/05/23 [name] JAYSON Linear, PT, DPT Physical Therapist - [facility]  Outpatient Physical Therapy- Main Campus [phone]

## 2023-09-06 ENCOUNTER — Ambulatory Visit: Admitting: Physical Therapy

## 2023-09-06 DIAGNOSIS — M542 Cervicalgia: Secondary | ICD-10-CM

## 2023-09-06 DIAGNOSIS — M6281 Muscle weakness (generalized): Secondary | ICD-10-CM

## 2023-09-06 DIAGNOSIS — M5459 Other low back pain: Secondary | ICD-10-CM

## 2023-09-06 DIAGNOSIS — M546 Pain in thoracic spine: Secondary | ICD-10-CM
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
## 2023-09-11 ENCOUNTER — Ambulatory Visit

## 2023-09-12 ENCOUNTER — Ambulatory Visit

## 2023-09-13 ENCOUNTER — Ambulatory Visit

## 2023-09-14 ENCOUNTER — Ambulatory Visit: Attending: *Deleted

## 2023-09-14 DIAGNOSIS — M6281 Muscle weakness (generalized): Secondary | ICD-10-CM | POA: Insufficient documentation

## 2023-09-14 DIAGNOSIS — M546 Pain in thoracic spine: Secondary | ICD-10-CM | POA: Insufficient documentation

## 2023-09-14 DIAGNOSIS — M5459 Other low back pain: Secondary | ICD-10-CM | POA: Insufficient documentation

## 2023-09-14 DIAGNOSIS — M542 Cervicalgia: Secondary | ICD-10-CM | POA: Insufficient documentation
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
## 2023-09-18 ENCOUNTER — Ambulatory Visit: Admitting: Physical Therapy

## 2023-09-20 ENCOUNTER — Telehealth: Payer: Self-pay

## 2023-09-20 ENCOUNTER — Ambulatory Visit

## 2023-09-20 NOTE — Telephone Encounter (Signed)
 Pt did not arrive for scheduled appointment. No telephone call nor message preceeded this absence. Author attempted to contact pt via telephone number listed in chart. Pt reports she was scheduled for an iron infusion this morning and figured PT appointment would automatically be canceled. Author explained that it is pt responsibility to inform front office about any scheduling/cancellation/rescheduling needs, these are not automatically changed in such circumstances. Pt reminded of next appointment on Thursday at 4:15pm.  3:59 PM, 09/20/23 [name] JAYSON Linear, PT, DPT Physical Therapist - [facility]  Outpatient Physical Therapy- Main Campus [phone]

## 2023-09-25 ENCOUNTER — Ambulatory Visit: Admitting: Physical Therapy

## 2023-09-26 ENCOUNTER — Ambulatory Visit

## 2023-09-27 ENCOUNTER — Ambulatory Visit

## 2023-09-28 ENCOUNTER — Ambulatory Visit: Admitting: Physical Therapy

## 2023-09-28 ENCOUNTER — Telehealth: Payer: Self-pay

## 2023-09-28 NOTE — Telephone Encounter (Signed)
 Patient called due to no show at today's physical therapy appointment. Patient's voicemail full and unable to leave message for next appointment time/date. Will attempt again at later time/date.    Dorothy Mann  Leopoldo PT, DPT Physical Therapist - [facility]  Outpatient Physical Therapy- Main Campus [phone]

## 2023-10-02 ENCOUNTER — Ambulatory Visit: Admitting: Physical Therapy

## 2023-10-04 ENCOUNTER — Ambulatory Visit

## 2023-10-04 DIAGNOSIS — M5459 Other low back pain: Secondary | ICD-10-CM

## 2023-10-04 DIAGNOSIS — M542 Cervicalgia: Secondary | ICD-10-CM | POA: Diagnosis not present

## 2023-10-04 DIAGNOSIS — M6281 Muscle weakness (generalized): Secondary | ICD-10-CM

## 2023-10-04 DIAGNOSIS — M546 Pain in thoracic spine: Secondary | ICD-10-CM
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
## 2023-10-10 ENCOUNTER — Ambulatory Visit: Admitting: Physical Therapy

## 2023-10-11 ENCOUNTER — Ambulatory Visit

## 2023-10-12 ENCOUNTER — Ambulatory Visit: Admitting: Physical Therapy

## 2023-10-17 ENCOUNTER — Ambulatory Visit: Admitting: Physical Therapy

## 2023-10-24 ENCOUNTER — Ambulatory Visit

## 2023-10-26 ENCOUNTER — Ambulatory Visit: Admitting: Physical Therapy

## 2023-10-31 ENCOUNTER — Ambulatory Visit: Admitting: Physical Therapy

## 2023-11-02 ENCOUNTER — Ambulatory Visit

## 2023-11-07 ENCOUNTER — Ambulatory Visit

## 2023-11-09 ENCOUNTER — Ambulatory Visit

## 2023-11-14 ENCOUNTER — Ambulatory Visit

## 2023-11-16 ENCOUNTER — Ambulatory Visit
